# Patient Record
Sex: Female | Born: 1985 | Race: White | Hispanic: No | Marital: Married | Smoking: Never smoker
Health system: Southern US, Community
[De-identification: ages and names within clinical notes are randomized; demographics above are authoritative.]

## PROBLEM LIST (undated history)

## (undated) DIAGNOSIS — D649 Anemia, unspecified: Secondary | ICD-10-CM

## (undated) DIAGNOSIS — M549 Dorsalgia, unspecified: Secondary | ICD-10-CM

## (undated) DIAGNOSIS — F32A Depression, unspecified: Secondary | ICD-10-CM

## (undated) DIAGNOSIS — R011 Cardiac murmur, unspecified: Secondary | ICD-10-CM

## (undated) DIAGNOSIS — M2550 Pain in unspecified joint: Secondary | ICD-10-CM

## (undated) DIAGNOSIS — J9819 Other pulmonary collapse: Secondary | ICD-10-CM

## (undated) DIAGNOSIS — O24419 Gestational diabetes mellitus in pregnancy, unspecified control: Secondary | ICD-10-CM

## (undated) HISTORY — DX: Depression, unspecified: F32.A

## (undated) HISTORY — DX: Other pulmonary collapse: J98.19

## (undated) HISTORY — DX: Anemia, unspecified: D64.9

## (undated) HISTORY — DX: Gestational diabetes mellitus in pregnancy, unspecified control: O24.419

## (undated) HISTORY — DX: Pain in unspecified joint: M25.50

## (undated) HISTORY — PX: ANKLE SURGERY: SHX546

## (undated) HISTORY — DX: Dorsalgia, unspecified: M54.9

---

## 2005-09-18 ENCOUNTER — Emergency Department: Payer: Self-pay | Admitting: Emergency Medicine

## 2006-11-28 ENCOUNTER — Emergency Department: Payer: Self-pay | Admitting: Emergency Medicine

## 2007-07-06 ENCOUNTER — Emergency Department: Payer: Self-pay | Admitting: Emergency Medicine

## 2008-09-28 ENCOUNTER — Emergency Department: Payer: Self-pay | Admitting: Emergency Medicine

## 2010-09-17 ENCOUNTER — Inpatient Hospital Stay: Payer: Self-pay | Admitting: Psychiatry

## 2013-10-13 ENCOUNTER — Emergency Department: Payer: Self-pay | Admitting: Emergency Medicine

## 2013-10-13 LAB — CBC WITH DIFFERENTIAL/PLATELET
BASOS ABS: 0.1 10*3/uL (ref 0.0–0.1)
BASOS PCT: 0.6 %
EOS ABS: 0.1 10*3/uL (ref 0.0–0.7)
EOS PCT: 1.5 %
HCT: 40 % (ref 35.0–47.0)
HGB: 13.4 g/dL (ref 12.0–16.0)
Lymphocyte #: 2 10*3/uL (ref 1.0–3.6)
Lymphocyte %: 20.1 %
MCH: 29 pg (ref 26.0–34.0)
MCHC: 33.5 g/dL (ref 32.0–36.0)
MCV: 86 fL (ref 80–100)
MONO ABS: 0.8 x10 3/mm (ref 0.2–0.9)
Monocyte %: 7.7 %
Neutrophil #: 7.2 10*3/uL — ABNORMAL HIGH (ref 1.4–6.5)
Neutrophil %: 70.1 %
Platelet: 276 10*3/uL (ref 150–440)
RBC: 4.63 10*6/uL (ref 3.80–5.20)
RDW: 13.2 % (ref 11.5–14.5)
WBC: 10.2 10*3/uL (ref 3.6–11.0)

## 2013-10-13 LAB — COMPREHENSIVE METABOLIC PANEL
ALK PHOS: 69 U/L
ALT: 31 U/L (ref 12–78)
Albumin: 3.9 g/dL (ref 3.4–5.0)
Anion Gap: 3 — ABNORMAL LOW (ref 7–16)
BILIRUBIN TOTAL: 0.2 mg/dL (ref 0.2–1.0)
BUN: 17 mg/dL (ref 7–18)
CALCIUM: 9 mg/dL (ref 8.5–10.1)
Chloride: 106 mmol/L (ref 98–107)
Co2: 28 mmol/L (ref 21–32)
Creatinine: 0.7 mg/dL (ref 0.60–1.30)
EGFR (African American): >60
EGFR (Non-African Amer.): >60
GLUCOSE: 86 mg/dL (ref 65–99)
Osmolality: 275 (ref 275–301)
POTASSIUM: 3.5 mmol/L (ref 3.5–5.1)
SGOT(AST): 23 U/L (ref 15–37)
SODIUM: 137 mmol/L (ref 136–145)
Total Protein: 7.5 g/dL (ref 6.4–8.2)

## 2013-10-13 LAB — URINALYSIS, COMPLETE
BACTERIA: NONE SEEN
Bilirubin,UR: NEGATIVE
Glucose,UR: NEGATIVE mg/dL (ref 0–75)
KETONE: NEGATIVE
Nitrite: NEGATIVE
PROTEIN: NEGATIVE
Ph: 6 (ref 4.5–8.0)
SPECIFIC GRAVITY: 1.025 (ref 1.003–1.030)
Squamous Epithelial: 4

## 2013-10-13 LAB — HCG, QUANTITATIVE, PREGNANCY: BETA HCG, QUANT.: 423 m[IU]/mL — AB

## 2014-05-18 ENCOUNTER — Observation Stay: Payer: Self-pay

## 2014-05-18 LAB — URINALYSIS, COMPLETE
BACTERIA: NONE SEEN
BILIRUBIN, UR: NEGATIVE
BLOOD: NEGATIVE
Bacteria: NONE SEEN
Bilirubin,UR: NEGATIVE
Blood: NEGATIVE
Glucose,UR: NEGATIVE mg/dL (ref 0–75)
Glucose,UR: NEGATIVE mg/dL (ref 0–75)
Leukocyte Esterase: NEGATIVE
Leukocyte Esterase: NEGATIVE
NITRITE: NEGATIVE
Nitrite: NEGATIVE
PROTEIN: NEGATIVE
Ph: 7 (ref 4.5–8.0)
Ph: 7 (ref 4.5–8.0)
Protein: NEGATIVE
RBC,UR: <1 /HPF (ref 0–5)
Specific Gravity: 1.015 (ref 1.003–1.030)
Specific Gravity: 1.012 (ref 1.003–1.030)
Squamous Epithelial: 2
WBC UR: <1 /HPF (ref 0–5)

## 2014-06-20 ENCOUNTER — Inpatient Hospital Stay: Payer: Self-pay | Admitting: Obstetrics and Gynecology

## 2014-06-20 LAB — CBC WITH DIFFERENTIAL/PLATELET
BASOS ABS: 0.1 10*3/uL (ref 0.0–0.1)
BASOS PCT: 0.4 %
EOS ABS: 0.2 10*3/uL (ref 0.0–0.7)
Eosinophil %: 1.7 %
HCT: 31.9 % — ABNORMAL LOW (ref 35.0–47.0)
HGB: 9.7 g/dL — ABNORMAL LOW (ref 12.0–16.0)
Lymphocyte #: 2 10*3/uL (ref 1.0–3.6)
Lymphocyte %: 13.9 %
MCH: 22.6 pg — ABNORMAL LOW (ref 26.0–34.0)
MCHC: 30.4 g/dL — ABNORMAL LOW (ref 32.0–36.0)
MCV: 75 fL — ABNORMAL LOW (ref 80–100)
MONO ABS: 1 x10 3/mm — AB (ref 0.2–0.9)
Monocyte %: 7.2 %
NEUTROS ABS: 10.8 10*3/uL — AB (ref 1.4–6.5)
Neutrophil %: 76.8 %
PLATELETS: 250 10*3/uL (ref 150–440)
RBC: 4.28 10*6/uL (ref 3.80–5.20)
RDW: 16.1 % — AB (ref 11.5–14.5)
WBC: 14.1 10*3/uL — ABNORMAL HIGH (ref 3.6–11.0)

## 2014-06-22 LAB — HEMATOCRIT: HCT: 24.6 % — ABNORMAL LOW (ref 35.0–47.0)

## 2015-01-06 DIAGNOSIS — M19071 Primary osteoarthritis, right ankle and foot: Secondary | ICD-10-CM | POA: Insufficient documentation

## 2015-01-06 DIAGNOSIS — M87071 Idiopathic aseptic necrosis of right ankle: Secondary | ICD-10-CM | POA: Insufficient documentation

## 2015-01-11 ENCOUNTER — Other Ambulatory Visit: Payer: Self-pay | Admitting: Orthopaedic Surgery

## 2015-01-11 DIAGNOSIS — M25571 Pain in right ankle and joints of right foot: Secondary | ICD-10-CM

## 2015-01-20 ENCOUNTER — Ambulatory Visit
Admission: RE | Admit: 2015-01-20 | Discharge: 2015-01-20 | Disposition: A | Discharge status: Home / Self Care | Payer: No Typology Code available for payment source | Source: Ambulatory Visit | Attending: Orthopaedic Surgery | Admitting: Orthopaedic Surgery

## 2015-01-20 DIAGNOSIS — Z9889 Other specified postprocedural states: Secondary | ICD-10-CM | POA: Diagnosis not present

## 2015-01-20 DIAGNOSIS — M25571 Pain in right ankle and joints of right foot: Secondary | ICD-10-CM

## 2015-01-20 DIAGNOSIS — M19071 Primary osteoarthritis, right ankle and foot: Secondary | ICD-10-CM | POA: Insufficient documentation

## 2015-01-20 DIAGNOSIS — Z87828 Personal history of other (healed) physical injury and trauma: Secondary | ICD-10-CM | POA: Insufficient documentation

## 2015-01-26 NOTE — H&P (Signed)
L&D Evaluation:  History:  HPI 28yo MWF presents at 34w with c/o vaginal spotting x 1 day and n/v x 2 days with low grade fever. denies decreased fetal mov't or other concerns; PNC normal to date.   Presents with back pain, nausea/vomiting, vaginal bleeding, HA   Patient's Medical History No Chronic Illness   Patient's Surgical History Appendectomy   Medications Pre Natal Vitamins  Iron  Tylenol (Acetaminophen)  phenergan prn   Allergies NKDA   Social History none   Family History Non-Contributory   ROS:  ROS All systems were reviewed.  HEENT, CNS, GI, GU, Respiratory, CV, Renal and Musculoskeletal systems were found to be normal.   Exam:  Vital Signs stable   Urine Protein negative dipstick   General no apparent distress   Mental Status clear   Abdomen gravid, non-tender   Estimated Fetal Weight Average for gestational age   Edema no edema   Pelvic cervix closed and thick   Mebranes Intact   FHT normal rate with no decels   Fetal Heart Rate 150   Ucx absent   Skin dry   Impression:  Impression dehydration, viral gastritis at 34w   Plan:  Plan UA, EFM/NST, fluids, antiemetics   Follow Up Appointment d/c home after 2nd bag infused and feeling better   Electronic Signatures: Ulyses AmorBurr, Melody N (CNM)  (Signed 31-Aug-15 17:46)  Authored: L&D Evaluation   Last Updated: 31-Aug-15 17:46 by Ulyses AmorBurr, Melody N (CNM)

## 2015-01-26 NOTE — H&P (Signed)
L&D Evaluation:  History:  HPI presents at 246w4d with SROM at 0230a of clear fluid with bloody show; irregular contractions; G1P0000. Normal PNC to date, LLP resolved by 28 weeks   Presents with leaking fluid   Patient's Medical History No Chronic Illness   Patient's Surgical History Appendectomy  knee surgery   Medications Pre Natal Vitamins  Iron  Tylenol (Acetaminophen)   Allergies NKDA   Social History tobacco  EtOH  none since pregnant   Family History Non-Contributory   ROS:  ROS All systems were reviewed.  HEENT, CNS, GI, GU, Respiratory, CV, Renal and Musculoskeletal systems were found to be normal.   Exam:  Vital Signs stable   General no apparent distress   Mental Status clear   Chest clear   Heart normal sinus rhythm   Abdomen gravid, non-tender   Estimated Fetal Weight Average for gestational age   Fetal Position vtx   Edema no edema   Pelvic ft/50/-2 per RN exam   Mebranes Ruptured   Description clear   FHT normal rate with no decels   Ucx irregular   Impression:  Impression SROM @ 39 weeks   Plan:  Plan monitor contractions and for cervical change   Electronic Signatures: Yolanda BonineBurr, Yahsir Wickens N (CNM)  (Signed 03-Oct-15 05:15)  Authored: L&D Evaluation   Last Updated: 03-Oct-15 05:15 by Ulyses AmorBurr, Doshia Dalia N (CNM)

## 2015-10-10 ENCOUNTER — Encounter: Payer: Self-pay | Admitting: *Deleted

## 2015-10-10 DIAGNOSIS — R112 Nausea with vomiting, unspecified: Secondary | ICD-10-CM | POA: Insufficient documentation

## 2015-10-10 DIAGNOSIS — R197 Diarrhea, unspecified: Secondary | ICD-10-CM | POA: Insufficient documentation

## 2015-10-10 DIAGNOSIS — Z3202 Encounter for pregnancy test, result negative: Secondary | ICD-10-CM | POA: Insufficient documentation

## 2015-10-10 DIAGNOSIS — N390 Urinary tract infection, site not specified: Secondary | ICD-10-CM | POA: Insufficient documentation

## 2015-10-10 LAB — URINALYSIS COMPLETE WITH MICROSCOPIC (ARMC ONLY)
BACTERIA UA: NONE SEEN
Bilirubin Urine: NEGATIVE
Glucose, UA: NEGATIVE mg/dL
Ketones, ur: NEGATIVE mg/dL
NITRITE: NEGATIVE
PROTEIN: NEGATIVE mg/dL
SPECIFIC GRAVITY, URINE: 1.029 (ref 1.005–1.030)
pH: 5 (ref 5.0–8.0)

## 2015-10-10 LAB — COMPREHENSIVE METABOLIC PANEL
ALBUMIN: 3.9 g/dL (ref 3.5–5.0)
ALT: 39 U/L (ref 14–54)
AST: 35 U/L (ref 15–41)
Alkaline Phosphatase: 77 U/L (ref 38–126)
Anion gap: 8 (ref 5–15)
BUN: 15 mg/dL (ref 6–20)
CHLORIDE: 105 mmol/L (ref 101–111)
CO2: 23 mmol/L (ref 22–32)
Calcium: 8.7 mg/dL — ABNORMAL LOW (ref 8.9–10.3)
Creatinine, Ser: 0.74 mg/dL (ref 0.44–1.00)
GFR calc Af Amer: >60 mL/min (ref >60)
GFR calc non Af Amer: >60 mL/min (ref >60)
GLUCOSE: 111 mg/dL — AB (ref 65–99)
POTASSIUM: 3.8 mmol/L (ref 3.5–5.1)
Sodium: 136 mmol/L (ref 135–145)
Total Bilirubin: 0.4 mg/dL (ref 0.3–1.2)
Total Protein: 7.4 g/dL (ref 6.5–8.1)

## 2015-10-10 LAB — CBC
HEMATOCRIT: 42.9 % (ref 35.0–47.0)
Hemoglobin: 14.1 g/dL (ref 12.0–16.0)
MCH: 27 pg (ref 26.0–34.0)
MCHC: 33 g/dL (ref 32.0–36.0)
MCV: 81.7 fL (ref 80.0–100.0)
Platelets: 212 10*3/uL (ref 150–440)
RBC: 5.25 MIL/uL — ABNORMAL HIGH (ref 3.80–5.20)
RDW: 13.9 % (ref 11.5–14.5)
WBC: 6.7 10*3/uL (ref 3.6–11.0)

## 2015-10-10 LAB — POCT PREGNANCY, URINE: PREG TEST UR: NEGATIVE

## 2015-10-10 LAB — LIPASE, BLOOD: Lipase: 24 U/L (ref 11–51)

## 2015-10-10 NOTE — ED Notes (Signed)
Pt c/o abdominal pain x 3 weeks, black stool starting x 2 days ago. Pt c/o increased abdominal pain when black stools began. Pt c/o nausea, vomiting x 1. Pt denies dysuria.j

## 2015-10-11 ENCOUNTER — Emergency Department: Payer: No Typology Code available for payment source

## 2015-10-11 ENCOUNTER — Emergency Department
Admission: EM | Admit: 2015-10-11 | Discharge: 2015-10-11 | Disposition: A | Discharge status: Home / Self Care | Payer: Self-pay | Attending: Emergency Medicine | Admitting: Emergency Medicine

## 2015-10-11 DIAGNOSIS — N390 Urinary tract infection, site not specified: Secondary | ICD-10-CM

## 2015-10-11 DIAGNOSIS — R1084 Generalized abdominal pain: Secondary | ICD-10-CM

## 2015-10-11 DIAGNOSIS — R197 Diarrhea, unspecified: Secondary | ICD-10-CM

## 2015-10-11 MED ORDER — CIPROFLOXACIN HCL 500 MG PO TABS: 500.0000 mg | ORAL_TABLET | Freq: Two times a day (BID) | ORAL | Status: DC

## 2015-10-11 MED ORDER — CIPROFLOXACIN HCL 500 MG PO TABS
500.0000 mg | ORAL_TABLET | Freq: Once | ORAL | Status: AC
  Administered 2015-10-11: 500 mg via ORAL
  Filled 2015-10-11: qty 1

## 2015-10-11 MED ORDER — IOHEXOL 240 MG/ML SOLN
25.0000 mL | Freq: Once | INTRAMUSCULAR | Status: AC | PRN
  Administered 2015-10-11: 25 mL via ORAL

## 2015-10-11 MED ORDER — ONDANSETRON 4 MG PO TBDP: 4.0000 mg | ORAL_TABLET | Freq: Three times a day (TID) | ORAL | Status: DC | PRN

## 2015-10-11 MED ORDER — FAMOTIDINE 20 MG PO TABS: 20.0000 mg | ORAL_TABLET | Freq: Two times a day (BID) | ORAL | Status: DC

## 2015-10-11 MED ORDER — ONDANSETRON HCL 4 MG/2ML IJ SOLN
4.0000 mg | Freq: Once | INTRAMUSCULAR | Status: AC
  Administered 2015-10-11: 4 mg via INTRAVENOUS
  Filled 2015-10-11: qty 2

## 2015-10-11 MED ORDER — MORPHINE SULFATE (PF) 2 MG/ML IV SOLN
2.0000 mg | Freq: Once | INTRAVENOUS | Status: AC
  Administered 2015-10-11: 2 mg via INTRAVENOUS
  Filled 2015-10-11: qty 1

## 2015-10-11 MED ORDER — SODIUM CHLORIDE 0.9 % IV BOLUS (SEPSIS)
1000.0000 mL | Freq: Once | INTRAVENOUS | Status: AC
  Administered 2015-10-11: 1000 mL via INTRAVENOUS

## 2015-10-11 MED ORDER — IOHEXOL 300 MG/ML  SOLN
100.0000 mL | Freq: Once | INTRAMUSCULAR | Status: AC | PRN
  Administered 2015-10-11: 100 mL via INTRAVENOUS

## 2015-10-11 MED ORDER — DICYCLOMINE HCL 20 MG PO TABS: 20.0000 mg | ORAL_TABLET | Freq: Four times a day (QID) | ORAL | Status: DC | PRN

## 2015-10-11 NOTE — ED Notes (Signed)
Patient transported to CT 

## 2015-10-11 NOTE — ED Notes (Signed)
Patient with no complaints at this time. Respirations even and unlabored. Skin warm/dry. Discharge instructions reviewed with patient at this time. Patient given opportunity to voice concerns/ask questions. IV removed per policy and band-aid applied to site. Patient discharged at this time and left Emergency Department with steady gait.  

## 2015-10-11 NOTE — Discharge Instructions (Signed)
1. Take antibiotic as prescribed (Cipro 500 mg twice daily 3 days). 2. Start stomach medicine as prescribed (Pepcid 20 mg twice daily #60). 3. You may take medicines as needed for pain and nausea (Bentyl/Zofran #20). 4. Bland diet 5 days, then slowly advance diet as tolerated. 5. Return to the ER for worsening symptoms, persistent vomiting, difficulty breathing or other concerns.  Abdominal Pain, Adult Many things can cause abdominal pain. Usually, abdominal pain is not caused by a disease and will improve without treatment. It can often be observed and treated at home. Your health care provider will do a physical exam and possibly order blood tests and X-rays to help determine the seriousness of your pain. However, in many cases, more time must pass before a clear cause of the pain can be found. Before that point, your health care provider may not know if you need more testing or further treatment. HOME CARE INSTRUCTIONS Monitor your abdominal pain for any changes. The following actions may help to alleviate any discomfort you are experiencing:  Only take over-the-counter or prescription medicines as directed by your health care provider.  Do not take laxatives unless directed to do so by your health care provider.  Try a clear liquid diet (broth, tea, or water) as directed by your health care provider. Slowly move to a bland diet as tolerated. SEEK MEDICAL CARE IF:  You have unexplained abdominal pain.  You have abdominal pain associated with nausea or diarrhea.  You have pain when you urinate or have a bowel movement.  You experience abdominal pain that wakes you in the night.  You have abdominal pain that is worsened or improved by eating food.  You have abdominal pain that is worsened with eating fatty foods.  You have a fever. SEEK IMMEDIATE MEDICAL CARE IF:  Your pain does not go away within 2 hours.  You keep throwing up (vomiting).  Your pain is felt only in portions  of the abdomen, such as the right side or the left lower portion of the abdomen.  You pass bloody or black tarry stools. MAKE SURE YOU:  Understand these instructions.  Will watch your condition.  Will get help right away if you are not doing well or get worse.   This information is not intended to replace advice given to you by your health care provider. Make sure you discuss any questions you have with your health care provider.   Document Released: 06/14/2005 Document Revised: 05/26/2015 Document Reviewed: 05/14/2013 Elsevier Interactive Patient Education 2016 [employer]  Urinary Tract Infection Urinary tract infections (UTIs) can develop anywhere along your urinary tract. Your urinary tract is your body's drainage system for removing wastes and extra water. Your urinary tract includes two kidneys, two ureters, a bladder, and a urethra. Your kidneys are a pair of bean-shaped organs. Each kidney is about the size of your fist. They are located below your ribs, one on each side of your spine. CAUSES Infections are caused by microbes, which are microscopic organisms, including fungi, viruses, and bacteria. These organisms are so small that they can only be seen through a microscope. Bacteria are the microbes that most commonly cause UTIs. SYMPTOMS  Symptoms of UTIs may vary by age and gender of the patient and by the location of the infection. Symptoms in young women typically include a frequent and intense urge to urinate and a painful, burning feeling in the bladder or urethra during urination. Older women and men are more likely to be  tired, shaky, and weak and have muscle aches and abdominal pain. A fever may mean the infection is in your kidneys. Other symptoms of a kidney infection include pain in your back or sides below the ribs, nausea, and vomiting. DIAGNOSIS To diagnose a UTI, your caregiver will ask you about your symptoms. Your caregiver will also ask you to provide a urine  sample. The urine sample will be tested for bacteria and white blood cells. White blood cells are made by your body to help fight infection. TREATMENT  Typically, UTIs can be treated with medication. Because most UTIs are caused by a bacterial infection, they usually can be treated with the use of antibiotics. The choice of antibiotic and length of treatment depend on your symptoms and the type of bacteria causing your infection. HOME CARE INSTRUCTIONS  If you were prescribed antibiotics, take them exactly as your caregiver instructs you. Finish the medication even if you feel better after you have only taken some of the medication.  Drink enough water and fluids to keep your urine clear or pale yellow.  Avoid caffeine, tea, and carbonated beverages. They tend to irritate your bladder.  Empty your bladder often. Avoid holding urine for long periods of time.  Empty your bladder before and after sexual intercourse.  After a bowel movement, women should cleanse from front to back. Use each tissue only once. SEEK MEDICAL CARE IF:   You have back pain.  You develop a fever.  Your symptoms do not begin to resolve within 3 days. SEEK IMMEDIATE MEDICAL CARE IF:   You have severe back pain or lower abdominal pain.  You develop chills.  You have nausea or vomiting.  You have continued burning or discomfort with urination. MAKE SURE YOU:   Understand these instructions.  Will watch your condition.  Will get help right away if you are not doing well or get worse.   This information is not intended to replace advice given to you by your health care provider. Make sure you discuss any questions you have with your health care provider.   Document Released: 06/14/2005 Document Revised: 05/26/2015 Document Reviewed: 10/13/2011 Elsevier Interactive Patient Education 2016 Elsevier Inc.  Bloody Diarrhea Bloody diarrhea can be caused by many different conditions. Most of the time bloody  diarrhea is the result of food poisoning or minor infections. Bloody diarrhea usually improves over 2 to 3 days of rest and fluid replacement. Other conditions that can cause bloody diarrhea include:  Internal bleeding.  Infection.  Diseases of the bowel and colon. Internal bleeding from an ulcer or bowel disease can be severe and requires hospital care or even surgery. DIAGNOSIS  To find out what is wrong your caregiver may check your:  Stool.  Blood.  Results from a test that looks inside the body (endoscopy). TREATMENT   Get plenty of rest.  Drink enough water and fluids to keep your urine clear or pale yellow.  Do not smoke.  Solid foods and dairy products should be avoided until your illness improves.  As you improve, slowly return to a regular diet with easily-digested foods first. Examples are:  Bananas.  Rice.  Toast.  Crackers. You should only need these for about 2 days before adding more normal foods to your diet.  Avoid spicy or fatty foods as well as caffeine and alcohol for several days.  Medicine to control cramping and diarrhea can relieve symptoms but may prolong some cases of bloody diarrhea. Antibiotics can speed recovery from  diarrhea due to some bacterial infections. Call your caregiver if diarrhea does not get better in 3 days. SEEK MEDICAL CARE IF:   You do not improve after 3 days.  Your diarrhea improves but your stool appears black. SEEK IMMEDIATE MEDICAL CARE IF:   You become extremely weak or faint.  You become very sweaty.  You have increased pain or bleeding.  You develop repeated vomiting.  You vomit and you see blood or the vomit looks black in color.  You have a fever.   This information is not intended to replace advice given to you by your health care provider. Make sure you discuss any questions you have with your health care provider.   Document Released: 09/04/2005 Document Revised: 09/25/2014 Document Reviewed:  08/06/2009 Elsevier Interactive Patient Education [employer]

## 2015-10-11 NOTE — ED Notes (Signed)
MD at bedside. 

## 2015-10-11 NOTE — ED Provider Notes (Signed)
[facility] Emergency Department Provider Note  ____________________________________________  Time seen: Approximately 1:08 AM  I have reviewed the triage vital signs and the nursing notes.   HISTORY  Chief Complaint Abdominal Pain    HPI Sheila Mendez is a 30 y.o. female who presents to the ED from home with a chief complaint of abdominal pain. Patient complains of generalized abdominal pain 3 weeks. Noted loose, black stools 2 days. Patient states she has had approximately 5-7 episodes of diarrhea. Had one episode of nausea and vomiting. Denies use of anticoagulants or iron tablets. Denies recent travel or trauma. Denies associated fever, chills, chest pain, shortness of breath, dysuria. Has not had prior symptoms. No personal history of bowel issues. Nothing makes her pain better or worse.Denies daily alcohol use. Denies excessive use of NSAIDs. Denies pelvic pain, vaginal discharge or vaginal bleeding. No prior history of ovarian cysts.   Past Medical history None  There are no active problems to display for this patient.   History reviewed. No pertinent past surgical history.  No current outpatient prescriptions on file.  Allergies Review of patient's allergies indicates no known allergies.  History reviewed. No pertinent family history.  Social History Social History  Substance Use Topics  . Smoking status: Never Smoker   . Smokeless tobacco: Never Used  . Alcohol Use: Yes     Comment: rarely    Review of Systems Constitutional: No fever/chills Eyes: No visual changes. ENT: No sore throat. Cardiovascular: Denies chest pain. Respiratory: Denies shortness of breath. Gastrointestinal: Positive for abdominal pain.  Positive for one episode of nausea and vomiting.  Positive for several episodes of dark-looking diarrhea.  No constipation. Genitourinary: Negative for dysuria. Musculoskeletal: Negative for back pain. Skin: Negative for  rash. Neurological: Negative for headaches, focal weakness or numbness.  10-point ROS otherwise negative.  ____________________________________________   PHYSICAL EXAM:  VITAL SIGNS: ED Triage Vitals  Enc Vitals Group     BP 10/10/15 2145 121/78 mmHg     Pulse Rate 10/10/15 2145 115     Resp 10/10/15 2145 18     Temp 10/10/15 2145 99.1 F (37.3 C)     Temp Source 10/10/15 2145 Oral     SpO2 10/10/15 2145 95 %     Weight 10/10/15 2145 160 lb (72.576 kg)     Height 10/10/15 2145  (1.626 m)     Head Cir --      Peak Flow --      Pain Score 10/10/15 2151 5     Pain Loc --      Pain Edu? --      Excl. in GC? --     Constitutional: Alert and oriented. Well appearing and in no acute distress. Eyes: Conjunctivae are normal. PERRL. EOMI. Head: Atraumatic. Nose: No congestion/rhinnorhea. Mouth/Throat: Mucous membranes are moist.  Oropharynx non-erythematous. Neck: No stridor.   Cardiovascular: Normal rate, regular rhythm. Grossly normal heart sounds.  Good peripheral circulation. Respiratory: Normal respiratory effort.  No retractions. Lungs CTAB. Gastrointestinal: Soft and mildly tender to palpation diffusely without rebound or guarding. Small tenderness at epigastrium and left lower quadrant. No distention. No abdominal bruits. No CVA tenderness. Musculoskeletal: No lower extremity tenderness nor edema.  No joint effusions. Neurologic:  Normal speech and language. No gross focal neurologic deficits are appreciated. No gait instability. Skin:  Skin is warm, dry and intact. No rash noted. Psychiatric: Mood and affect are normal. Speech and behavior are normal.  ____________________________________________   LABS (  all labs ordered are listed, but only abnormal results are displayed)  Labs Reviewed  COMPREHENSIVE METABOLIC PANEL - Abnormal; Notable for the following:    Glucose, Bld 111 (*)    Calcium 8.7 (*)    All other components within normal limits  CBC - Abnormal;  Notable for the following:    RBC 5.25 (*)    All other components within normal limits  URINALYSIS COMPLETEWITH MICROSCOPIC ([facility] ONLY) - Abnormal; Notable for the following:    Color, Urine YELLOW (*)    APPearance HAZY (*)    Hgb urine dipstick 1+ (*)    Leukocytes, UA 1+ (*)    Squamous Epithelial / LPF 0-5 (*)    All other components within normal limits  LIPASE, BLOOD  POC OCCULT BLOOD, ED  POC URINE PREG, ED  POCT PREGNANCY, URINE   ____________________________________________  EKG  None ____________________________________________  RADIOLOGY  CT abdomen/pelvis interpreted per Dr. Cherly Hensen: Unremarkable contrast-enhanced CT of the abdomen and pelvis. ____________________________________________   PROCEDURES  Procedure(s) performed:   Rectal exam: No gross blood evident. No external hemorrhoids. Digital rectal exam performed on gloved finger; no stool obtained.   Critical Care performed: No  ____________________________________________   INITIAL IMPRESSION / ASSESSMENT AND PLAN / ED COURSE  Pertinent labs & imaging results that were available during my care of the patient were reviewed by me and considered in my medical decision making (see chart for details).  30 year old female who presents with a 3 week history of generalized abdominal pain, maximally and epigastrium and left lower quadrant. Also complains of black, loose stools starting 2 days ago. Updated patient of unremarkable laboratory results. Urinalysis notable for 1+ leukocytes. Will initiate IV fluid resuscitation, obtain CT abdomen/pelvis to evaluate for etiology of patient's abdominal pain.  ----------------------------------------- 2:34 AM on 10/11/2015 -----------------------------------------  Patient feeling better. Updated patient and family member of negative CT results. Feel patient's symptoms likely consistent with gastritis. Will place on H2 blockers and follow-up with GI on an  outpatient basis for further evaluation. Will treat UTI. Strict return precautions given. Patient verbalizes understanding and agrees with plan of care. ____________________________________________   FINAL CLINICAL IMPRESSION(S) / ED DIAGNOSES  Final diagnoses:  Generalized abdominal pain  UTI (lower urinary tract infection)      Irean Hong, MD 10/11/15 [id number]

## 2017-09-08 IMAGING — CT CT ABD-PELV W/ CM
1 of 2 series · 15 of 32 positions shown, 19 images · IV contrast (omnipaque)
Comparison: Pelvic ultrasound performed 10/13/2013

CLINICAL DATA: Acute onset of generalized abdominal pain and
nausea. Tarry stools. Initial encounter.

EXAM:
CT ABDOMEN AND PELVIS WITH CONTRAST
TECHNIQUE: Multidetector CT imaging of the abdomen and pelvis was performed
using the standard protocol following bolus administration of
intravenous contrast.
CONTRAST:  100mL OMNIPAQUE IOHEXOL 300 MG/ML  SOLN

[Series 2: routine abd pel with · axial · 0.76mm/px · z∈[-1058,-612]mm · 15 of 99 slices shown, 19 images]
[im 5/99  soft-tissue]
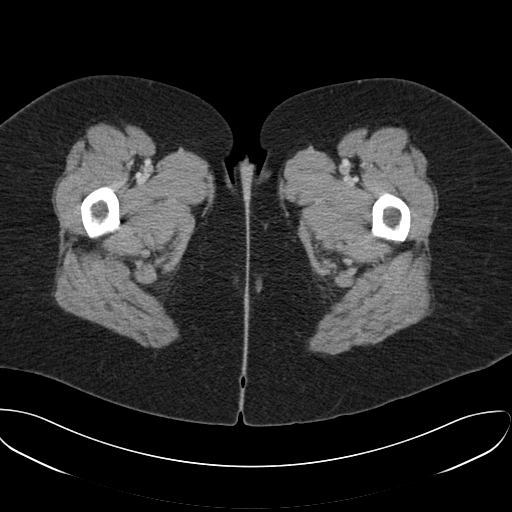
[im 5/99  bone]
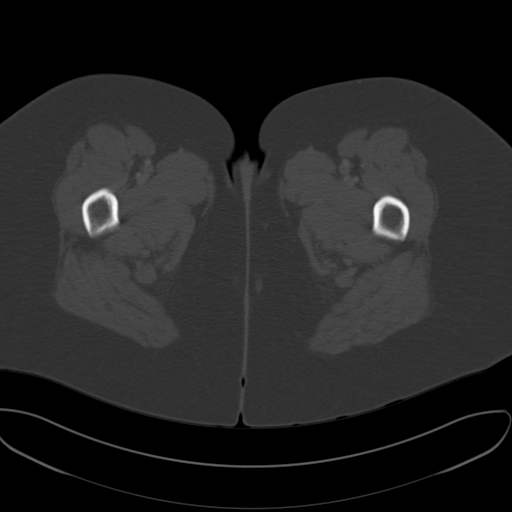
[im 13/99  soft-tissue]
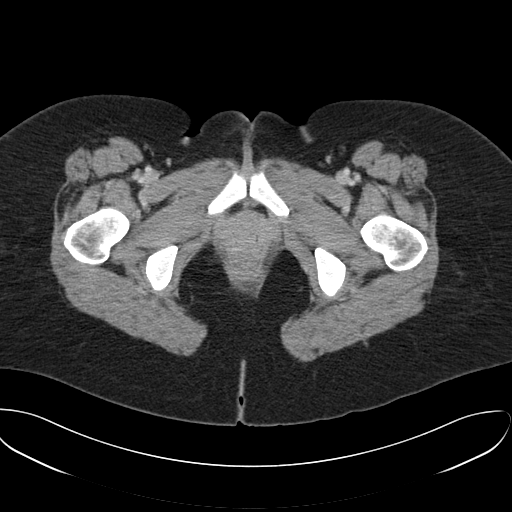
[im 21/99  soft-tissue]
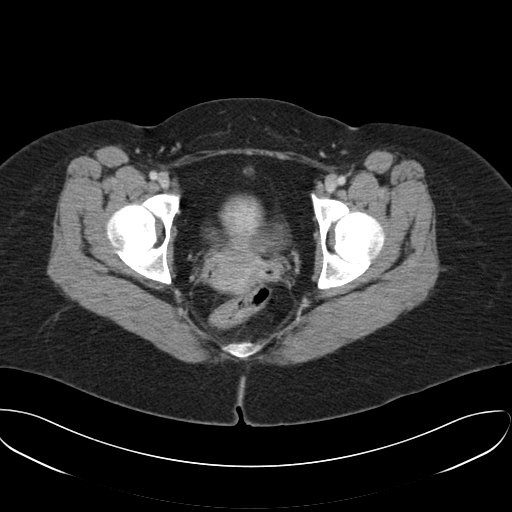
[im 29/99  soft-tissue]
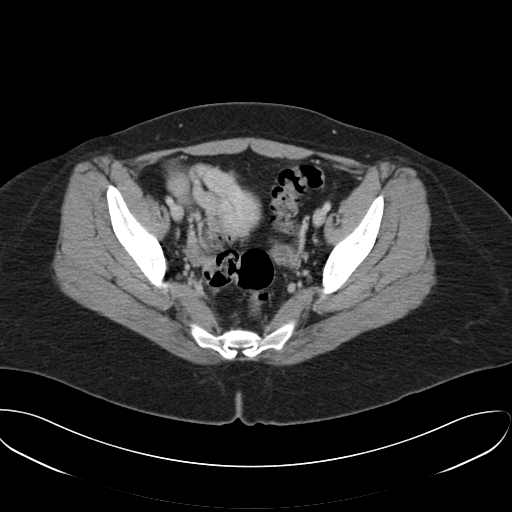
[im 33/99  soft-tissue]
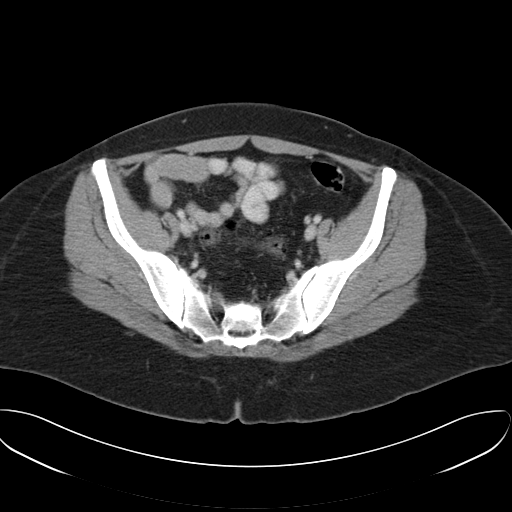
[im 41/99  soft-tissue]
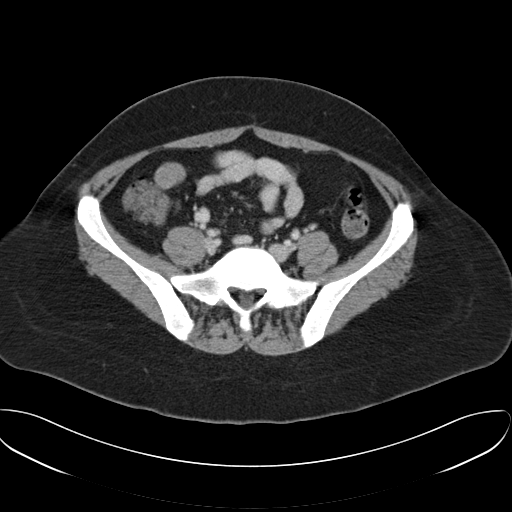
[im 50/99  soft-tissue]
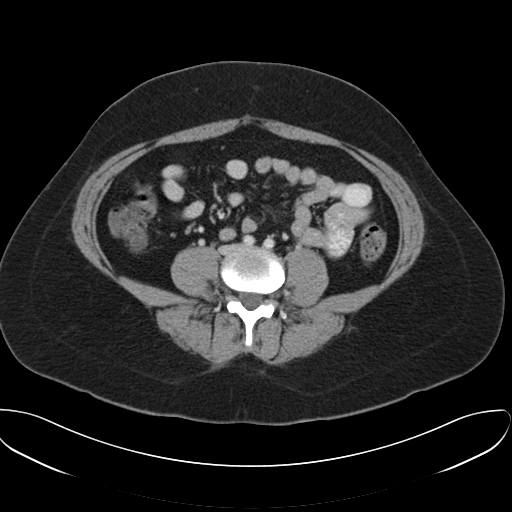
[im 58/99  soft-tissue]
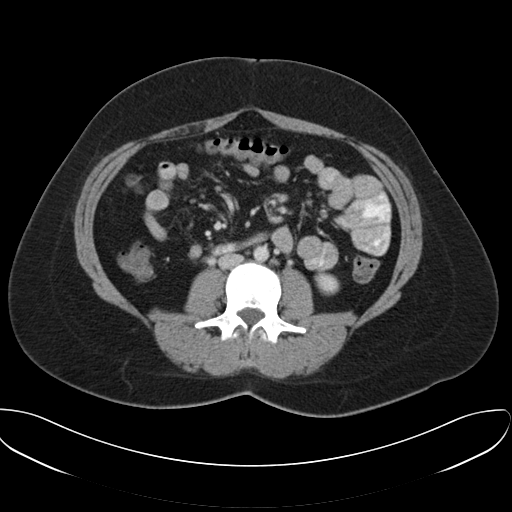
[im 66/99  soft-tissue]
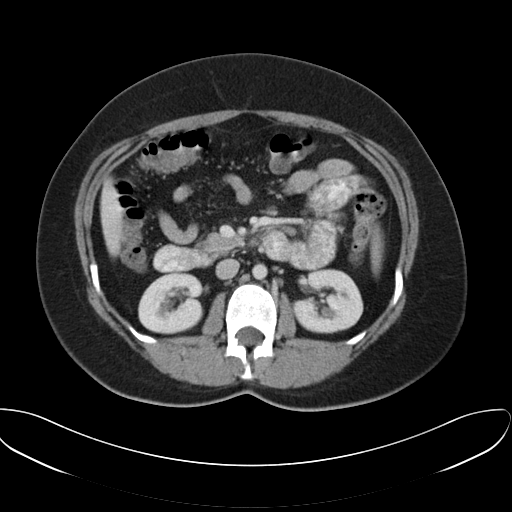
[im 66/99  bone]
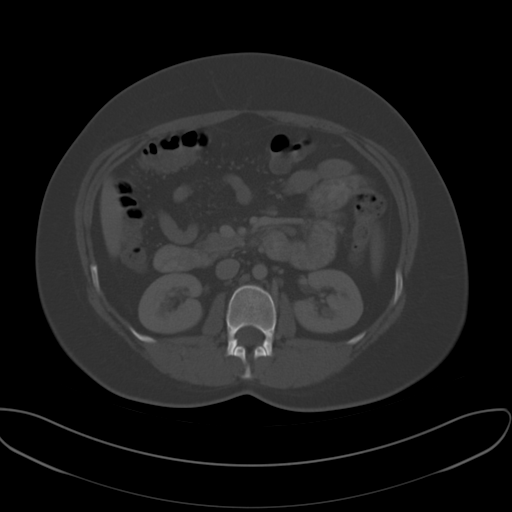
[im 70/99  soft-tissue]
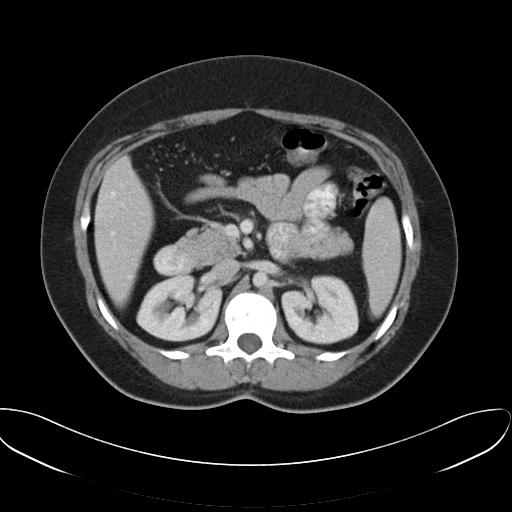
[im 78/99  soft-tissue]
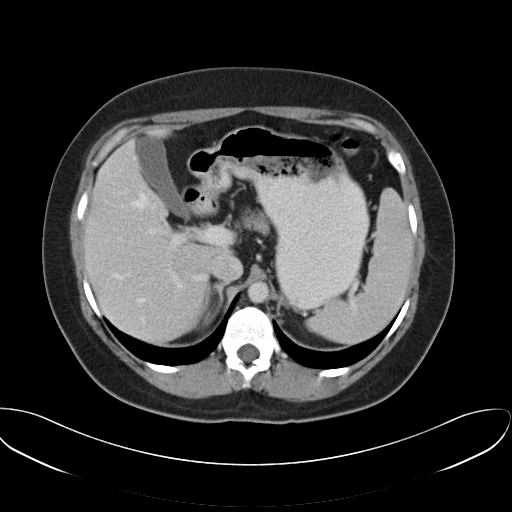
[im 82/99  lung]
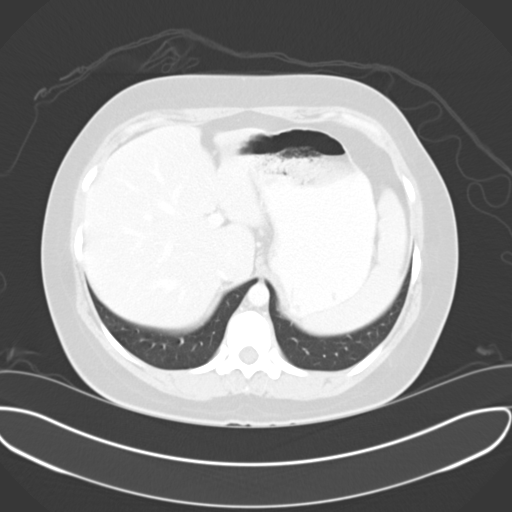
[im 86/99  soft-tissue]
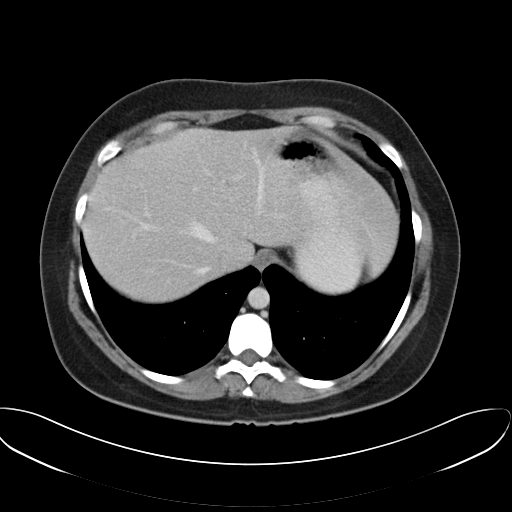
[im 86/99  lung]
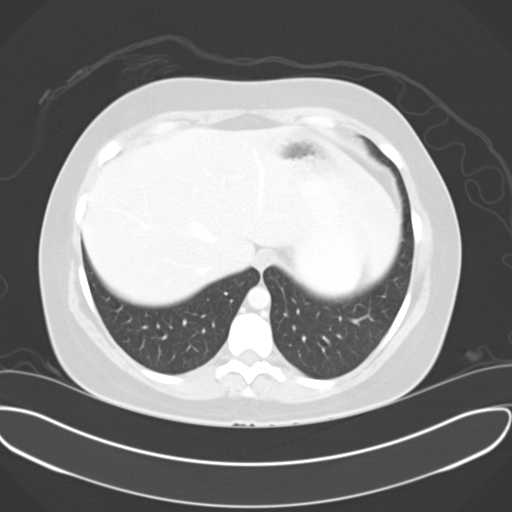
[im 90/99  lung]
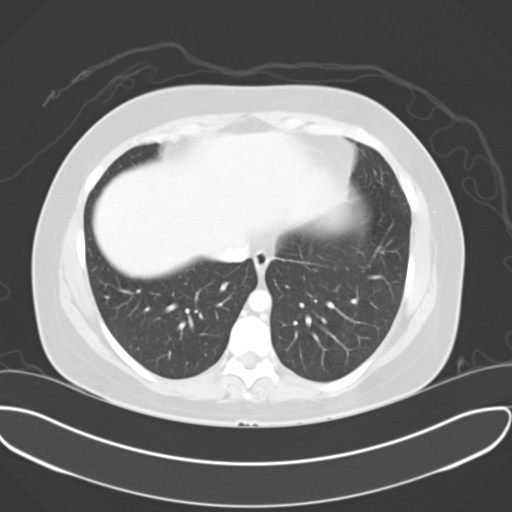
[im 94/99  soft-tissue]
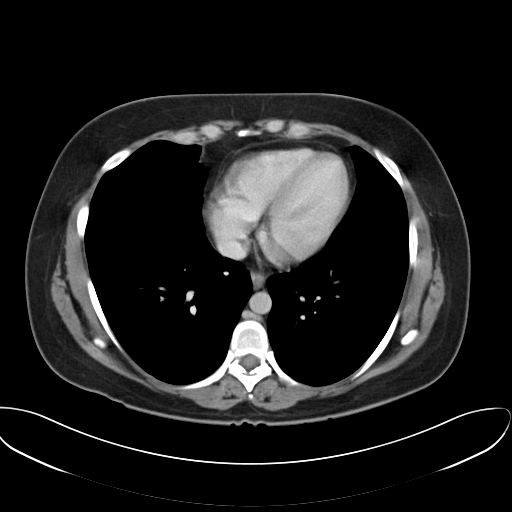
[im 94/99  lung]
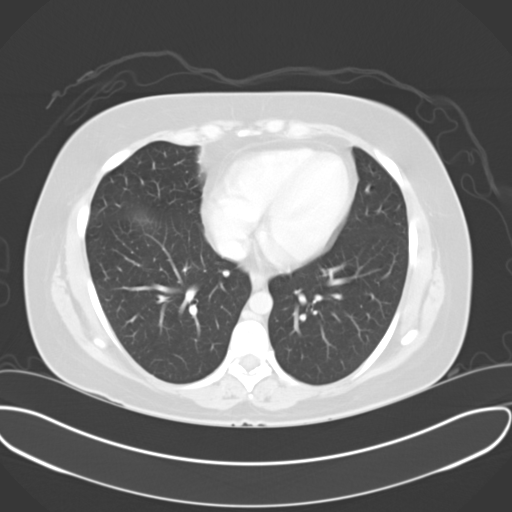

[15 of 32 positions shown; findings below may reference images not displayed]

FINDINGS: The visualized lung bases are clear.

The liver and spleen are unremarkable in appearance. The gallbladder
is within normal limits. The pancreas and adrenal glands are
unremarkable.

The kidneys are unremarkable in appearance. There is no evidence of
hydronephrosis. No renal or ureteral stones are seen. No perinephric
stranding is appreciated.

No free fluid is identified. The small bowel is unremarkable in
appearance. The stomach is within normal limits. No acute vascular
abnormalities are seen.

The appendix is normal in caliber, without evidence of appendicitis.
The colon is partially filled with fluid and air, and is grossly
unremarkable in appearance.

The bladder is mildly distended grossly remarkable. The uterus is
unremarkable in appearance. The ovaries are relatively symmetric. No
suspicious adnexal masses are seen. No inguinal lymphadenopathy is
seen.

No acute osseous abnormalities are identified.
IMPRESSION: Unremarkable contrast-enhanced CT of the abdomen and pelvis.

## 2017-09-18 NOTE — L&D Delivery Note (Signed)
Delivery Note:  0907 In room to see patient, reports pelvic pressure and the urge to push. Room prepared for second stage. SVE: 10/100/+1, vertex.   Effective maternal pushing efforts in various positions. [id number] Spontaneous vaginal birth of liveborn female infant in middle occiput anterior position with vertex/compound-left arm presentation. Infant immediately to maternal abdomen. Delayed cord clamping, skin to skin, three (3) vessel cord, and cord blood collected. APGARS: 8, 9. Weight: 3590 g (7lb 15 oz). Receiving nurse present at bedside for birth.   [id number] Spontaneous delivery of intact placenta. Pitocin started. Uterus firm. Large gush of blood present. Inspection of vaginal vault revealed sulcus laceration. Vaginal packing placed. Dr. Valentino Saxonherry notified and requested made for bedside evaluation. Vaginal and perineal inspection preformed by Dr. Valentino Saxonherry with adequate epidural anesthesia. Unrepaired sulcus laceration, hemostatic after removal of vaginal packing. Third degree perineal laceration repaired until second degree laceration with 2-0 vicyrl by Dr. Valentino Saxonherry. Second degree perineal and side wall lacerations repaired with 3-0 vicryl rapide by CNM. Lacerations, well approximated and hemostatic. Vault check completed. Uterus: firm. Lochia: small. Counts correct x 2. QBL: 245 ml.   Initiate routine postpartum care and orders. Mom to postpartum.  Baby to Couplet care / Skin to Skin.  FOB present at bedside and overjoyed with the birth of "[name]".    Sheila Mendez, CNM [facility], [id number] 09/13/2018, 11:39 AM

## 2018-02-27 ENCOUNTER — Telehealth: Payer: Self-pay | Admitting: Obstetrics and Gynecology

## 2018-02-27 NOTE — Telephone Encounter (Signed)
The patient called and stated that she had her first abby with Melody, Now the patient is coming back to our office for prenatal care and is experiencing extreme nausea and sickness. She is unable to keep any food down. The patient is hoping to have something sent in to help due to her having to reschedule her Nurse visit due to short nurse staff. No other information was disclosed. Please advise.

## 2018-03-11 ENCOUNTER — Ambulatory Visit: Payer: Medicaid Other | Admitting: Certified Nurse Midwife

## 2018-03-11 ENCOUNTER — Encounter: Payer: Self-pay | Admitting: Certified Nurse Midwife

## 2018-03-11 ENCOUNTER — Ambulatory Visit (INDEPENDENT_AMBULATORY_CARE_PROVIDER_SITE_OTHER): Payer: Medicaid Other

## 2018-03-11 ENCOUNTER — Ambulatory Visit (INDEPENDENT_AMBULATORY_CARE_PROVIDER_SITE_OTHER): Payer: Medicaid Other | Admitting: Certified Nurse Midwife

## 2018-03-11 ENCOUNTER — Other Ambulatory Visit: Payer: Self-pay | Admitting: Certified Nurse Midwife

## 2018-03-11 VITALS — BP 122/63 | HR 84 | Wt 188.7 lb

## 2018-03-11 DIAGNOSIS — E663 Overweight: Secondary | ICD-10-CM

## 2018-03-11 DIAGNOSIS — N926 Irregular menstruation, unspecified: Secondary | ICD-10-CM

## 2018-03-11 DIAGNOSIS — Z3A13 13 weeks gestation of pregnancy: Secondary | ICD-10-CM

## 2018-03-11 DIAGNOSIS — O219 Vomiting of pregnancy, unspecified: Secondary | ICD-10-CM

## 2018-03-11 DIAGNOSIS — Z124 Encounter for screening for malignant neoplasm of cervix: Secondary | ICD-10-CM

## 2018-03-11 DIAGNOSIS — Z3492 Encounter for supervision of normal pregnancy, unspecified, second trimester: Secondary | ICD-10-CM

## 2018-03-11 DIAGNOSIS — O3680X0 Pregnancy with inconclusive fetal viability, not applicable or unspecified: Secondary | ICD-10-CM

## 2018-03-11 MED ORDER — PROMETHAZINE HCL 25 MG PO TABS: 25.0000 mg | ORAL_TABLET | Freq: Four times a day (QID) | ORAL | 1 refills | Status: DC | PRN

## 2018-03-11 NOTE — Telephone Encounter (Signed)
Pt came in 03/11/18 gave samples of bonjesta

## 2018-03-11 NOTE — Progress Notes (Signed)
NEW OB HISTORY AND PHYSICAL  SUBJECTIVE:       Sheila Mendez is a 32 y.o. G1P0 female, Patient's last menstrual period was 12/09/2017., Estimated Date of Delivery: 09/15/18, 7122w1d, presents today for establishment of Prenatal Care.  Report nausea with vomiting and breast tenderness.   Denies difficulty breathing or respiratory distress, chest pain, abdominal pain, vaginal bleeding, dysuria, and leg pain or swelling.   Desires genetic screening. FOB present for today's exam.   Gynecologic History  Patient's last menstrual period was 12/09/2017.   Contraception: none   Last Pap: 2015. Results were: normal  Obstetric History  OB History  Gravida Para Term Preterm AB Living  2 1 1     1   SAB TAB Ectopic Multiple Live Births          1    # Outcome Date GA Lbr Len/2nd Weight Sex Delivery Anes PTL Lv  2 Current           1 Term 06/21/14     Vag-Spont   LIV    No past medical history on file.  No past surgical history on file.  Current Outpatient Medications on File Prior to Visit  Medication Sig Dispense Refill  . Prenatal Vit-Fe Fumarate-FA (PRENATAL MULTIVITAMIN) TABS tablet Take 1 tablet by mouth daily at 12 noon.    . promethazine (PHENERGAN) 25 MG tablet Take 1 tablet (25 mg total) by mouth every 6 (six) hours as needed for nausea or vomiting. 30 tablet 1   No current facility-administered medications on file prior to visit.     No Known Allergies  Social History   Socioeconomic History  . Marital status: Single    Spouse name: Not on file  . Number of children: Not on file  . Years of education: Not on file  . Highest education level: Not on file  Occupational History  . Not on file  Social Needs  . Financial resource strain: Not on file  . Food insecurity:    Worry: Not on file    Inability: Not on file  . Transportation needs:    Medical: Not on file    Non-medical: Not on file  Tobacco Use  . Smoking status: Never Smoker  . Smokeless tobacco:  Never Used  Substance and Sexual Activity  . Alcohol use: Yes    Comment: rarely  . Drug use: No  . Sexual activity: Never  Lifestyle  . Physical activity:    Days per week: Not on file    Minutes per session: Not on file  . Stress: Not on file  Relationships  . Social connections:    Talks on phone: Not on file    Gets together: Not on file    Attends religious service: Not on file    Active member of club or organization: Not on file    Attends meetings of clubs or organizations: Not on file    Relationship status: Not on file  . Intimate partner violence:    Fear of current or ex partner: Not on file    Emotionally abused: Not on file    Physically abused: Not on file    Forced sexual activity: Not on file  Other Topics Concern  . Not on file  Social History Narrative  . Not on file    No family history on file.  The following portions of the patient's history were reviewed and updated as appropriate: allergies, current medications, past OB history, past medical history,  past surgical history, past family history, past social history, and problem list.    OBJECTIVE:  BP 122/63   Pulse 84   Wt 188 lb 11.2 oz (85.6 kg)   LMP 12/09/2017   BMI 32.39 kg/m   Initial Physical Exam (New OB)  GENERAL APPEARANCE: alert, well appearing, in no apparent distress  HEAD: normocephalic, atraumatic  MOUTH: mucous membranes moist, pharynx normal without lesions  THYROID: no thyromegaly or masses present  BREASTS: not examined  LUNGS: clear to auscultation, no wheezes, rales or rhonchi, symmetric air entry  HEART: regular rate and rhythm, no murmurs  ABDOMEN: soft, nontender, nondistended, no abnormal masses, no epigastric pain and obese  EXTREMITIES: no redness or tenderness in the calves or thighs, no edema  SKIN: normal coloration and turgor, no rashes  LYMPH NODES: no adenopathy palpable  NEUROLOGIC: alert, oriented, normal speech, no focal findings or  movement disorder noted  PELVIC EXAM EXTERNAL GENITALIA: normal appearing vulva with no masses, tenderness or lesions VAGINA: no abnormal discharge or lesions CERVIX: no lesions or cervical motion tenderness; Pap collected  ULTRASOUND REPORT  Location: [facility] Date of Service:  03/11/2018  Indications: Dating/Viability Findings:  Mason Jim intrauterine pregnancy is visualized with a CRL consistent with 13 1/[redacted] weeks gestation, giving an (U/S) EDD of 09/15/18. A clinical EDD has not been stablished.  FHR: 162 BPM CRL measurement: 69.7 mm Early anatomy is normal. Placenta visualized and appears posterior.  Right Ovary measures 3.0 x 2.2 x 1.7 cm. It is normal in appearance. Left Ovary measures 2.8 x 2.1 x 1.9 cm. It is normal appearance. There is no obvious evidence of a corpus luteal cyst. Survey of the adnexa demonstrates no adnexal masses. There is no free peritoneal fluid in the cul de sac.  Impression: 1. 13 1/7 week Viable Singleton Intrauterine pregnancy by U/S. 2. A clinical EDD has not been established, so today's ultrasound should be used.  Recommendations: 1.Clinical correlation with the patient's History and Physical Exam. 2. Today's ultrasound should be used to establish EDD of 09/15/18.   ASSESSMENT: Normal pregnancy Desires genetic screening Rh negative BMI 32  PLAN: Prenatal care Panorama today New OB counseling: The patient has been given an overview regarding routine prenatal care. Recommendations regarding diet, weight gain, and exercise in pregnancy were given. Prenatal testing, optional genetic testing, and ultrasound use in pregnancy were reviewed.  Benefits of Breast Feeding were discussed. The patient is encouraged to consider nursing her baby post partum. See orders   Gunnar Bulla, CNM [facility], [facility]

## 2018-03-11 NOTE — Patient Instructions (Addendum)
Abdominal Pain During Pregnancy Belly (abdominal) pain is common during pregnancy. Most of the time, it is not a serious problem. Other times, it can be a sign that something is wrong with the pregnancy. Always tell your doctor if you have belly pain. Follow these instructions at home: Monitor your belly pain for any changes. The following actions may help you feel better:  Do not have sex (intercourse) or put anything in your vagina until you feel better.  Rest until your pain stops.  Drink clear fluids if you feel sick to your stomach (nauseous). Do not eat solid food until you feel better.  Only take medicine as told by your doctor.  Keep all doctor visits as told.  Get help right away if:  You are bleeding, leaking fluid, or pieces of tissue come out of your vagina.  You have more pain or cramping.  You keep throwing up (vomiting).  You have pain when you pee (urinate) or have blood in your pee.  You have a fever.  You do not feel your baby moving as much.  You feel very weak or feel like passing out.  You have trouble breathing, with or without belly pain.  You have a very bad headache and belly pain.  You have fluid leaking from your vagina and belly pain.  You keep having watery poop (diarrhea).  Your belly pain does not go away after resting, or the pain gets worse. This information is not intended to replace advice given to you by your health care provider. Make sure you discuss any questions you have with your health care provider. Document Released: 08/23/2009 Document Revised: 04/12/2016 Document Reviewed: 04/03/2013 Elsevier Interactive Patient Education  2018 [employer].  Back Pain in Pregnancy Back pain during pregnancy is common. Back pain may be caused by several factors that are related to changes during your pregnancy. Follow these instructions at home: Managing pain, stiffness, and swelling  If directed, apply ice for sudden (acute) back  pain. ? Put ice in a plastic bag. ? Place a towel between your skin and the bag. ? Leave the ice on for 20 minutes, 2-3 times per day.  If directed, apply heat to the affected area before you exercise: ? Place a towel between your skin and the heat pack or heating pad. ? Leave the heat on for 20-30 minutes. ? Remove the heat if your skin turns bright red. This is especially important if you are unable to feel pain, heat, or cold. You may have a greater risk of getting burned. Activity  Exercise as told by your health care provider. Exercising is the best way to prevent or manage back pain.  Listen to your body when lifting. If lifting hurts, ask for help or bend your knees. This uses your leg muscles instead of your back muscles.  Squat down when picking up something from the floor. Do not bend over.  Only use bed rest as told by your health care provider. Bed rest should only be used for the most severe episodes of back pain. Standing, Sitting, and Lying Down  Do not stand in one place for long periods of time.  Use good posture when sitting. Make sure your head rests over your shoulders and is not hanging forward. Use a pillow on your lower back if necessary.  Try sleeping on your side, preferably the left side, with a pillow or two between your legs. If you are sore after a night's rest,  your bed may be too soft. A firm mattress may provide more support for your back during pregnancy. General instructions  Do not wear high heels.  Eat a healthy diet. Try to gain weight within your health care provider's recommendations.  Use a maternity girdle, elastic sling, or back brace as told by your health care provider.  Take over-the-counter and prescription medicines only as told by your health care provider.  Keep all follow-up visits as told by your health care provider. This is important. This includes any visits with any specialists, such as a physical therapist. Contact a health  care provider if:  Your back pain interferes with your daily activities.  You have increasing pain in other parts of your body. Get help right away if:  You develop numbness, tingling, weakness, or problems with the use of your arms or legs.  You develop severe back pain that is not controlled with medicine.  You have a sudden change in bowel or bladder control.  You develop shortness of breath, dizziness, or you faint.  You develop nausea, vomiting, or sweating.  You have back pain that is a rhythmic, cramping pain similar to labor pains. Labor pain is usually 1-2 minutes apart, lasts for about 1 minute, and involves a bearing down feeling or pressure in your pelvis.  You have back pain and your water breaks or you have vaginal bleeding.  You have back pain or numbness that travels down your leg.  Your back pain developed after you fell.  You develop pain on one side of your back.  You see blood in your urine.  You develop skin blisters in the area of your back pain. This information is not intended to replace advice given to you by your health care provider. Make sure you discuss any questions you have with your health care provider. Document Released: 12/13/2005 Document Revised: 02/10/2016 Document Reviewed: 05/19/2015 Elsevier Interactive Patient Education  2018 [employer].  Morning Sickness Morning sickness is when you feel sick to your stomach (nauseous) during pregnancy. You may feel sick to your stomach and throw up (vomit). You may feel sick in the morning, but you can feel this way any time of day. Some women feel very sick to their stomach and cannot stop throwing up (hyperemesis gravidarum). Follow these instructions at home:  Only take medicines as told by your doctor.  Take multivitamins as told by your doctor. Taking multivitamins before getting pregnant can stop or lessen the harshness of morning sickness.  Eat dry toast or unsalted crackers before  getting out of bed.  Eat 5 to 6 small meals a day.  Eat dry and bland foods like rice and baked potatoes.  Do not drink liquids with meals. Drink between meals.  Do not eat greasy, fatty, or spicy foods.  Have someone cook for you if the smell of food causes you to feel sick or throw up.  If you feel sick to your stomach after taking prenatal vitamins, take them at night or with a snack.  Eat protein when you need a snack (nuts, yogurt, cheese).  Eat unsweetened gelatins for dessert.  Wear a bracelet used for sea sickness (acupressure wristband).  Go to a doctor that puts thin needles into certain body points (acupuncture) to improve how you feel.  Do not smoke.  Use a humidifier to keep the air in your house free of odors.  Get lots of fresh air. Contact a doctor if:  You need medicine to feel  better.  You feel dizzy or lightheaded.  You are losing weight. Get help right away if:  You feel very sick to your stomach and cannot stop throwing up.  You pass out (faint). This information is not intended to replace advice given to you by your health care provider. Make sure you discuss any questions you have with your health care provider. Document Released: 10/12/2004 Document Revised: 02/10/2016 Document Reviewed: 02/19/2013 Elsevier Interactive Patient Education  2017 [employer] WHAT OB PATIENTS CAN EXPECT   Confirmation of pregnancy and ultrasound ordered if medically indicated-[redacted] weeks gestation  New OB (NOB) intake with nurse and New OB (NOB) labs- [redacted] weeks gestation  New OB (NOB) physical examination with provider- 11/[redacted] weeks gestation  Flu vaccine-[redacted] weeks gestation  Anatomy scan-[redacted] weeks gestation  Glucose tolerance test, blood work to test for anemia, T-dap vaccine-[redacted] weeks gestation  Vaginal swabs/cultures-STD/Group B strep-[redacted] weeks gestation  Appointments every 4 weeks until 28 weeks  Every 2 weeks from 28 weeks until 36 weeks  Weekly visits  from 36 weeks until delivery   Eating Plan for Pregnant Women While you are pregnant, your body will require additional nutrition to help support your growing baby. It is recommended that you consume:  150 additional calories each day during your first trimester.  300 additional calories each day during your second trimester.  300 additional calories each day during your third trimester.  Eating a healthy, well-balanced diet is very important for your health and for your baby's health. You also have a higher need for some vitamins and minerals, such as folic acid, calcium, iron, and vitamin D. What do I need to know about eating during pregnancy?  Do not try to lose weight or go on a diet during pregnancy.  Choose healthy, nutritious foods. Choose  of a sandwich with a glass of milk instead of a candy bar or a high-calorie sugar-sweetened beverage.  Limit your overall intake of foods that have "empty calories." These are foods that have little nutritional value, such as sweets, desserts, candies, sugar-sweetened beverages, and fried foods.  Eat a variety of foods, especially fruits and vegetables.  Take a prenatal vitamin to help meet the additional needs during pregnancy, specifically for folic acid, iron, calcium, and vitamin D.  Remember to stay active. Ask your health care provider for exercise recommendations that are specific to you.  Practice good food safety and cleanliness, such as washing your hands before you eat and after you prepare raw meat. This helps to prevent foodborne illnesses, such as listeriosis, that can be very dangerous for your baby. Ask your health care provider for more information about listeriosis. What does 150 extra calories look like? Healthy options for an additional 150 calories each day could be any of the following:  Plain low-fat yogurt (6-8 oz) with  cup of berries.  1 apple with 2 teaspoons of peanut butter.  Cut-up vegetables with  cup of  hummus.  Low-fat chocolate milk (8 oz or 1 cup).  1 string cheese with 1 medium orange.   of a peanut butter and jelly sandwich on whole-wheat bread (1 tsp of peanut butter).  For 300 calories, you could eat two of those healthy options each day. What is a healthy amount of weight to gain? The recommended amount of weight for you to gain is based on your pre-pregnancy BMI. If your pre-pregnancy BMI was:  Less than 18 (underweight), you should gain 28-40 lb.  18-24.9 (normal), you should gain 25-35  lb.  25-29.9 (overweight), you should gain 15-25 lb.  Greater than 30 (obese), you should gain 11-20 lb.  What if I am having twins or multiples? Generally, pregnant women who will be having twins or multiples may need to increase their daily calories by 300-600 calories each day. The recommended range for total weight gain is 25-54 lb, depending on your pre-pregnancy BMI. Talk with your health care provider for specific guidance about additional nutritional needs, weight gain, and exercise during your pregnancy. What foods can I eat? Grains Any grains. Try to choose whole grains, such as whole-wheat bread, oatmeal, or brown rice. Vegetables Any vegetables. Try to eat a variety of colors and types of vegetables to get a full range of vitamins and minerals. Remember to wash your vegetables well before eating. Fruits Any fruits. Try to eat a variety of colors and types of fruit to get a full range of vitamins and minerals. Remember to wash your fruits well before eating. Meats and Other Protein Sources Lean meats, including chicken, [place], fish, and lean cuts of beef, veal, or pork. Make sure that all meats are cooked to "well done." Tofu. Tempeh. Beans. Eggs. Peanut butter and other nut butters. Seafood, such as shrimp, crab, and lobster. If you choose fish, select types that are higher in omega-3 fatty acids, including salmon, herring, mussels, trout, sardines, and pollock. Make sure that  all meats are cooked to food-safe temperatures. Dairy Pasteurized milk and milk alternatives. Pasteurized yogurt and pasteurized cheese. Cottage cheese. Sour cream. Beverages Water. Juices that contain 100% fruit juice or vegetable juice. Caffeine-free teas and decaffeinated coffee. Drinks that contain caffeine are okay to drink, but it is better to avoid caffeine. Keep your total caffeine intake to less than 200 mg each day (12 oz of coffee, tea, or soda) or as directed by your health care provider. Condiments Any pasteurized condiments. Sweets and Desserts Any sweets and desserts. Fats and Oils Any fats and oils. The items listed above may not be a complete list of recommended foods or beverages. Contact your dietitian for more options. What foods are not recommended? Vegetables Unpasteurized (raw) vegetable juices. Fruits Unpasteurized (raw) fruit juices. Meats and Other Protein Sources Cured meats that have nitrates, such as bacon, salami, and hotdogs. Luncheon meats, bologna, or other deli meats (unless they are reheated until they are steaming hot). Refrigerated pate, meat spreads from a meat counter, smoked seafood that is found in the refrigerated section of a store. Raw fish, such as sushi or sashimi. High mercury content fish, such as tilefish, shark, swordfish, and king mackerel. Raw meats, such as tuna or beef tartare. Undercooked meats and poultry. Make sure that all meats are cooked to food-safe temperatures. Dairy Unpasteurized (raw) milk and any foods that have raw milk in them. Soft cheeses, such as feta, queso blanco, queso fresco, Brie, Camembert cheeses, blue-veined cheeses, and Panela cheese (unless it is made with pasteurized milk, which must be stated on the label). Beverages Alcohol. Sugar-sweetened beverages, such as sodas, teas, or energy drinks. Condiments Homemade fermented foods and drinks, such as pickles, sauerkraut, or kombucha drinks. (Store-bought pasteurized  versions of these are okay.) Other Salads that are made in the store, such as ham salad, chicken salad, egg salad, tuna salad, and seafood salad. The items listed above may not be a complete list of foods and beverages to avoid. Contact your dietitian for more information. This information is not intended to replace advice given to you by your health care  provider. Make sure you discuss any questions you have with your health care provider. Document Released: 06/19/2014 Document Revised: 02/10/2016 Document Reviewed: 02/17/2014 Elsevier Interactive Patient Education  2018 [employer]. Common Medications Safe in Pregnancy  Acne:      Constipation:  Benzoyl Peroxide     Colace  Clindamycin      Dulcolax Suppository  Topica Erythromycin     Fibercon  Salicylic Acid      Metamucil         Miralax AVOID:        Senakot   Accutane    Cough:  Retin-A       Cough Drops  Tetracycline      Phenergan w/ Codeine if Rx  Minocycline      Robitussin (Plain & DM)  Antibiotics:     Crabs/Lice:  Ceclor       RID  Cephalosporins    AVOID:  E-Mycins      Kwell  Keflex  Macrobid/Macrodantin   Diarrhea:  Penicillin      Kao-Pectate  Zithromax      Imodium AD         PUSH FLUIDS AVOID:       Cipro     Fever:  Tetracycline      Tylenol (Regular or Extra  Minocycline       Strength)  Levaquin      Extra Strength-Do not          Exceed 8 tabs/24 hrs Caffeine:        <244m/day (equiv. To 1 cup of coffee or  approx. 3 12 oz sodas)         Gas: Cold/Hayfever:       Gas-X  Benadryl      Mylicon  Claritin       Phazyme  **Claritin-D        Chlor-Trimeton    Headaches:  Dimetapp      ASA-Free Excedrin  Drixoral-Non-Drowsy     Cold Compress  Mucinex (Guaifenasin)     Tylenol (Regular or Extra  Sudafed/Sudafed-12 Hour     Strength)  **Sudafed PE Pseudoephedrine   Tylenol Cold & Sinus     Vicks Vapor Rub  Zyrtec  **AVOID if Problems With Blood Pressure         Heartburn: Avoid lying  down for at least 1 hour after meals  Aciphex      Maalox     Rash:  Milk of Magnesia     Benadryl    Mylanta       1% Hydrocortisone Cream  Pepcid  Pepcid Complete   Sleep Aids:  Prevacid      Ambien   Prilosec       Benadryl  Rolaids       Chamomile Tea  Tums (Limit 4/day)     Unisom  Zantac       Tylenol PM         Warm milk-add vanilla or  Hemorrhoids:       Sugar for taste  Anusol/Anusol H.C.  (RX: Analapram 2.5%)  Sugar Substitutes:  Hydrocortisone OTC     Ok in moderation  Preparation H      Tucks        Vaseline lotion applied to tissue with wiping    Herpes:     Throat:  Acyclovir      Oragel  Famvir  Valtrex     Vaccines:         Flu  Shot Leg Cramps:       *Gardasil  Benadryl      Hepatitis A         Hepatitis B Nasal Spray:       Pneumovax  Saline Nasal Spray     Polio Booster         Tetanus Nausea:       Tuberculosis test or PPD  Vitamin B6 25 mg TID   AVOID:    Dramamine      *Gardasil  Emetrol       Live Poliovirus  Ginger Root 250 mg QID    MMR (measles, mumps &  High Complex Carbs @ Bedtime    rebella)  Sea Bands-Accupressure    Varicella (Chickenpox)  Unisom 1/2 tab TID     *No known complications           If received before Pain:         Known pregnancy;   Darvocet       Resume series after  Lortab        Delivery  Percocet    Yeast:   Tramadol      Femstat  Tylenol 3      Gyne-lotrimin  Ultram       Monistat  Vicodin           MISC:         All Sunscreens           Hair Coloring/highlights          Insect Repellant's          (Including DEET)         Mystic Tans Second Trimester of Pregnancy The second trimester is from week 13 through week 28, month 4 through 6. This is often the time in pregnancy that you feel your best. Often times, morning sickness has lessened or quit. You may have more energy, and you may get hungry more often. Your unborn baby (fetus) is growing rapidly. At the end of the sixth month, he or she is about 9 inches long  and weighs about 1 pounds. You will likely feel the baby move (quickening) between 18 and 20 weeks of pregnancy. Follow these instructions at home:  Avoid all smoking, herbs, and alcohol. Avoid drugs not approved by your doctor.  Do not use any tobacco products, including cigarettes, chewing tobacco, and electronic cigarettes. If you need help quitting, ask your doctor. You may get counseling or other support to help you quit.  Only take medicine as told by your doctor. Some medicines are safe and some are not during pregnancy.  Exercise only as told by your doctor. Stop exercising if you start having cramps.  Eat regular, healthy meals.  Wear a good support bra if your breasts are tender.  Do not use hot tubs, steam rooms, or saunas.  Wear your seat belt when driving.  Avoid raw meat, uncooked cheese, and liter boxes and soil used by cats.  Take your prenatal vitamins.  Take 1500-2000 milligrams of calcium daily starting at the 20th week of pregnancy until you deliver your baby.  Try taking medicine that helps you poop (stool softener) as needed, and if your doctor approves. Eat more fiber by eating fresh fruit, vegetables, and whole grains. Drink enough fluids to keep your pee (urine) clear or pale yellow.  Take warm water baths (sitz baths) to soothe pain or discomfort caused by hemorrhoids. Use hemorrhoid cream if your doctor approves.  If  you have puffy, bulging veins (varicose veins), wear support hose. Raise (elevate) your feet for 15 minutes, 3-4 times a day. Limit salt in your diet.  Avoid heavy lifting, wear low heals, and sit up straight.  Rest with your legs raised if you have leg cramps or low back pain.  Visit your dentist if you have not gone during your pregnancy. Use a soft toothbrush to brush your teeth. Be gentle when you floss.  You can have sex (intercourse) unless your doctor tells you not to.  Go to your doctor visits. Get help if:  You feel  dizzy.  You have mild cramps or pressure in your lower belly (abdomen).  You have a nagging pain in your belly area.  You continue to feel sick to your stomach (nauseous), throw up (vomit), or have watery poop (diarrhea).  You have bad smelling fluid coming from your vagina.  You have pain with peeing (urination). Get help right away if:  You have a fever.  You are leaking fluid from your vagina.  You have spotting or bleeding from your vagina.  You have severe belly cramping or pain.  You lose or gain weight rapidly.  You have trouble catching your breath and have chest pain.  You notice sudden or extreme puffiness (swelling) of your face, hands, ankles, feet, or legs.  You have not felt the baby move in over an hour.  You have severe headaches that do not go away with medicine.  You have vision changes. This information is not intended to replace advice given to you by your health care provider. Make sure you discuss any questions you have with your health care provider. Document Released: 11/29/2009 Document Revised: 02/10/2016 Document Reviewed: 11/05/2012 Elsevier Interactive Patient Education  2017 [employer].

## 2018-03-11 NOTE — Progress Notes (Signed)
NOB PE- pt came in for nob nurse intake, she had dating/viability today EDD 09/15/18

## 2018-03-12 ENCOUNTER — Encounter: Payer: Self-pay | Admitting: Certified Nurse Midwife

## 2018-03-12 LAB — URINALYSIS, ROUTINE W REFLEX MICROSCOPIC
Bilirubin, UA: NEGATIVE
GLUCOSE, UA: NEGATIVE
Nitrite, UA: NEGATIVE
PROTEIN UA: NEGATIVE
RBC, UA: NEGATIVE
SPEC GRAV UA: 1.028 (ref 1.005–1.030)
UUROB: 0.2 mg/dL (ref 0.2–1.0)
pH, UA: 5.5 (ref 5.0–7.5)

## 2018-03-12 LAB — MICROSCOPIC EXAMINATION: CASTS: NONE SEEN /LPF

## 2018-03-12 LAB — CBC WITH DIFFERENTIAL/PLATELET
Basophils Absolute: 0 10*3/uL (ref 0.0–0.2)
Basos: 0 %
EOS (ABSOLUTE): 0.2 10*3/uL (ref 0.0–0.4)
EOS: 2 %
HEMATOCRIT: 36.1 % (ref 34.0–46.6)
Hemoglobin: 12.3 g/dL (ref 11.1–15.9)
IMMATURE GRANULOCYTES: 0 %
Immature Grans (Abs): 0 10*3/uL (ref 0.0–0.1)
LYMPHS ABS: 2 10*3/uL (ref 0.7–3.1)
Lymphs: 25 %
MCH: 28.2 pg (ref 26.6–33.0)
MCHC: 34.1 g/dL (ref 31.5–35.7)
MCV: 83 fL (ref 79–97)
MONOCYTES: 7 %
Monocytes Absolute: 0.6 10*3/uL (ref 0.1–0.9)
Neutrophils Absolute: 5.2 10*3/uL (ref 1.4–7.0)
Neutrophils: 66 %
Platelets: 268 10*3/uL (ref 150–450)
RBC: 4.36 x10E6/uL (ref 3.77–5.28)
RDW: 13.9 % (ref 12.3–15.4)
WBC: 7.9 10*3/uL (ref 3.4–10.8)

## 2018-03-12 LAB — HEPATITIS B SURFACE ANTIGEN: Hepatitis B Surface Ag: NEGATIVE

## 2018-03-12 LAB — MONITOR DRUG PROFILE 14(MW)
AMPHETAMINE SCREEN URINE: NEGATIVE ng/mL
BARBITURATE SCREEN URINE: NEGATIVE ng/mL
BENZODIAZEPINE SCREEN, URINE: NEGATIVE ng/mL
Buprenorphine, Urine: NEGATIVE ng/mL
CANNABINOIDS UR QL SCN: NEGATIVE ng/mL
Cocaine (Metab) Scrn, Ur: NEGATIVE ng/mL
Creatinine(Crt), U: 289.7 mg/dL (ref 20.0–300.0)
Fentanyl, Urine: NEGATIVE pg/mL
METHADONE SCREEN, URINE: NEGATIVE ng/mL
Meperidine Screen, Urine: NEGATIVE ng/mL
OXYCODONE+OXYMORPHONE UR QL SCN: NEGATIVE ng/mL
Opiate Scrn, Ur: NEGATIVE ng/mL
PH UR, DRUG SCRN: 5.7 (ref 4.5–8.9)
PHENCYCLIDINE QUANTITATIVE URINE: NEGATIVE ng/mL
Propoxyphene Scrn, Ur: NEGATIVE ng/mL
SPECIFIC GRAVITY: 1.029
TRAMADOL SCREEN, URINE: NEGATIVE ng/mL

## 2018-03-12 LAB — GC/CHLAMYDIA PROBE AMP
Chlamydia trachomatis, NAA: NEGATIVE
Neisseria gonorrhoeae by PCR: NEGATIVE

## 2018-03-12 LAB — VARICELLA ZOSTER ANTIBODY, IGG: Varicella zoster IgG: 336 index (ref >165)

## 2018-03-12 LAB — ABO AND RH: Rh Factor: NEGATIVE

## 2018-03-12 LAB — TSH: TSH: 1.69 u[IU]/mL (ref 0.450–4.500)

## 2018-03-12 LAB — RUBELLA SCREEN: RUBELLA: 1.53 {index} (ref >0.99)

## 2018-03-12 LAB — HIV ANTIBODY (ROUTINE TESTING W REFLEX): HIV SCREEN 4TH GENERATION: NONREACTIVE

## 2018-03-12 LAB — HEMOGLOBIN A1C
ESTIMATED AVERAGE GLUCOSE: 103 mg/dL
HEMOGLOBIN A1C: 5.2 % (ref 4.8–5.6)

## 2018-03-12 LAB — RPR: RPR: NONREACTIVE

## 2018-03-12 LAB — ANTIBODY SCREEN: Antibody Screen: NEGATIVE

## 2018-03-13 LAB — URINE CULTURE

## 2018-03-14 ENCOUNTER — Telehealth: Payer: Self-pay

## 2018-03-14 LAB — PAPIG, HPV, RFX 16/18
HPV, high-risk: NEGATIVE
PAP Smear Comment: 0

## 2018-03-14 NOTE — Telephone Encounter (Signed)
Pt informed of MLs orders. Neg pap smear. Also encouraged to sign up for mychart.

## 2018-03-14 NOTE — Progress Notes (Signed)
Pap negative/negative. Encourage to activate MyChart. Thanks, JML

## 2018-04-08 ENCOUNTER — Encounter: Payer: Medicaid Other | Admitting: Certified Nurse Midwife

## 2018-04-09 ENCOUNTER — Ambulatory Visit (INDEPENDENT_AMBULATORY_CARE_PROVIDER_SITE_OTHER): Payer: Medicaid Other | Admitting: Certified Nurse Midwife

## 2018-04-09 VITALS — BP 110/57 | HR 104 | Wt 190.5 lb

## 2018-04-09 DIAGNOSIS — Z3492 Encounter for supervision of normal pregnancy, unspecified, second trimester: Secondary | ICD-10-CM | POA: Diagnosis not present

## 2018-04-09 LAB — POCT URINALYSIS DIPSTICK
BILIRUBIN UA: NEGATIVE
Blood, UA: NEGATIVE
GLUCOSE UA: NEGATIVE
Ketones, UA: NEGATIVE
Nitrite, UA: NEGATIVE
PH UA: 7 (ref 5.0–8.0)
Protein, UA: NEGATIVE
Spec Grav, UA: 1.015 (ref 1.010–1.025)
UROBILINOGEN UA: 0.2 U/dL

## 2018-04-09 NOTE — Progress Notes (Signed)
ROB- pt is doing well 

## 2018-04-09 NOTE — Progress Notes (Signed)
Body mass index is 32.7 kg/m. ROB, doing well. States that the nausea & vomiting have returned. It got better for a while and is now come back. She is using bonjesta that helps but makes her sleepy. She asks about medications for migraines. Discussed headaches in pregnancy, encouraged her to avoid triggers and to stay hydrated. Discussed use of Fioricet if headache is really debilitating her daily function. Reviewed benefits and risks of meds in pregnancy. She verbalizes understanding. She is managing at this time. Anticipatory guidance given for prenatal course and anatomy scan next visit. RTC with anatomy scan x3 weeks.  Shanika Creacy,SNM/Juddson Cobern,CNM

## 2018-04-09 NOTE — Patient Instructions (Signed)

## 2018-04-30 ENCOUNTER — Ambulatory Visit (INDEPENDENT_AMBULATORY_CARE_PROVIDER_SITE_OTHER): Payer: Medicaid Other

## 2018-04-30 ENCOUNTER — Ambulatory Visit (INDEPENDENT_AMBULATORY_CARE_PROVIDER_SITE_OTHER): Payer: Medicaid Other | Admitting: Certified Nurse Midwife

## 2018-04-30 VITALS — BP 95/54 | HR 86 | Wt 190.5 lb

## 2018-04-30 DIAGNOSIS — Z3482 Encounter for supervision of other normal pregnancy, second trimester: Secondary | ICD-10-CM

## 2018-04-30 DIAGNOSIS — Z363 Encounter for antenatal screening for malformations: Secondary | ICD-10-CM

## 2018-04-30 DIAGNOSIS — Z3492 Encounter for supervision of normal pregnancy, unspecified, second trimester: Secondary | ICD-10-CM

## 2018-04-30 MED ORDER — PROMETHAZINE HCL 25 MG PO TABS: 25.0000 mg | ORAL_TABLET | Freq: Four times a day (QID) | ORAL | 1 refills | Status: DC | PRN

## 2018-04-30 NOTE — Progress Notes (Signed)
ROB-Reports back pain. Discussed home treatment measures including use of abdominal support. Anatomy scan today normal, findings reviewed with patient. Naming female "[name]". Anticipatory guidance regarding course of prenatal care. Reviewed red flag symptoms and when to call. RTC x 4 weeks for ROB or sooner if needed.   ULTRASOUND REPORT  Location: [facility] Date of Service:  04/30/2018  Indications: Anatomy Findings:  Mason JimSingleton intrauterine pregnancy is visualized with FHR at 144 BPM. Biometrics give an (U/S) Gestational age of 34 1/7 weeks and an (U/S) EDD of 09/16/18; this correlates with the clinically established EDD of 09/15/18.  Fetal presentation is breech.  EFW: 341 grams (0lb 12oz). Placenta: Posterior and grade 1. AFI: WNL subjectively.  Anatomic survey is comoplete and appears WNL; Gender - Female.   Right Ovary measures 2.9 x 2.3 x 2.0 cm. It is normal in appearance. Left Ovary measures 2.6 x 2.2 x 1.9 cm. It is normal appearance. There is no obvious evidence of a corpus luteal cyst. Survey of the adnexa demonstrates no adnexal masses. There is no free peritoneal fluid in the cul de sac.  Impression: 1. 20 1/7 week Viable Singleton Intrauterine pregnancy by U/S. 2. (U/S) EDD is consistent with Clinically established (LMP) EDD of 09/15/18. 3. Normal Anatomy Scan  Recommendations: 1.Clinical correlation with the patient's History and Physical Exam.

## 2018-04-30 NOTE — Progress Notes (Signed)
Pt is here for an ROB visit. Had anatomy scan. 

## 2018-04-30 NOTE — Patient Instructions (Signed)
Rh Incompatibility Rh incompatibility is a condition that occurs during pregnancy if a woman has Rh-negative blood and her baby has Rh-positive blood. "Rh-negative" and "Rh-positive" refer to whether or not the blood has an Rh factor. An Rh factor is a specific protein found on the surface of red blood cells. If a woman has Rh factor, she is Rh-positive. If she does not have an Rh factor, she is Rh-negative. Having or not having an Rh factor does not affect the mother's general health. However, it can cause problems during pregnancy. What kind of problems can Rh incompatibility cause? During pregnancy, blood from the baby can cross into the mother's bloodstream, especially during delivery. If a mother is Rh-negative and the baby is Rh-positive, the mother's defense system will react to the baby's blood as if it was a foreign substance and will create proteins (antibodies). This is called sensitization. Once the mother is sensitized, her Rh antibodies will cross the placenta to the baby and attack the baby's Rh-positive blood as if it is a harmful substance. Rh incompatibility can also happen if the Rh-negative pregnant woman is exposed to the Rh factor during a blood transfusion with Rh-positive blood. How does this condition affect my baby? The Rh antibodies that attack and destroy the baby's red blood cells can lead to hemolytic disease in the baby. Hemolytic disease is when the red blood cells break down. This can cause:  Yellowing of the skin and eyes (jaundice).  The body to not have enough healthy red blood cells (anemia).  Brain damage.  Heart failure.  Death.  These antibodies usually do not cause problems during a first pregnancy. This is because the blood from the baby often times crosses into the mother's bloodstream during delivery, and the baby is born before many of the antibodies can develop. However, the antibodies stay in your body once they have formed. Because of this, Rh  incompatibility is more likely to cause problems in second or later pregnancies (if the baby is Rh-positive). How is this diagnosed? When a woman becomes pregnant, blood tests may be done to find out her blood type and Rh factor. If the woman is Rh-negative, she also may have another blood test called an antibody screen. The antibody screen shows whether she has Rh antibodies in her blood. If she does, it means she was exposed to Rh-positive blood before, and she is at risk for Rh incompatibility. To find out whether the baby is developing hemolytic anemia and how serious it is, caregivers may use more advanced tests, such as ultrasonography (commonly known as ultrasound). How is Rh incompatibility treated? Rh incompatibility is treated with a shot of medicine called Rho (D) immune globulin. This medicine keeps the woman's body from making antibodies that can cause serious problems in the baby or future babies. Two shots will be given, one at around your seventh month of pregnancy and the other within 72 hours of your baby being born. If you are Rh-negative, you will need this medicine every time you have a baby with Rh-positive blood. If you already have antibodies in your blood, Rho (D) immune globulin will not help. Your doctor will not give you this medicine, but will watch your pregnancy closely for problems instead. This shot may also be given to an Rh-negative woman when the risk of blood transfer between the mom and baby is high. The risk is high with:  An amniocentesis.  A miscarriage or an abortion.  An ectopic pregnancy.  Any  vaginal bleeding during pregnancy.  This information is not intended to replace advice given to you by your health care provider. Make sure you discuss any questions you have with your health care provider. Document Released: 02/24/2002 Document Revised: 02/10/2016 Document Reviewed: 12/17/2012 Elsevier Interactive Patient Education  2017 [place]. Common  Medications Safe in Pregnancy  Acne:      Constipation:  Benzoyl Peroxide     Colace  Clindamycin      Dulcolax Suppository  Topica Erythromycin     Fibercon  Salicylic Acid      Metamucil         Miralax AVOID:        Senakot   Accutane    Cough:  Retin-A       Cough Drops  Tetracycline      Phenergan w/ Codeine if Rx  Minocycline      Robitussin (Plain & DM)  Antibiotics:     Crabs/Lice:  Ceclor       RID  Cephalosporins    AVOID:  E-Mycins      Kwell  Keflex  Macrobid/Macrodantin   Diarrhea:  Penicillin      Kao-Pectate  Zithromax      Imodium AD         PUSH FLUIDS AVOID:       Cipro     Fever:  Tetracycline      Tylenol (Regular or Extra  Minocycline       Strength)  Levaquin      Extra Strength-Do not          Exceed 8 tabs/24 hrs Caffeine:        <288m/day (equiv. To 1 cup of coffee or  approx. 3 12 oz sodas)         Gas: Cold/Hayfever:       Gas-X  Benadryl      Mylicon  Claritin       Phazyme  **Claritin-D        Chlor-Trimeton    Headaches:  Dimetapp      ASA-Free Excedrin  Drixoral-Non-Drowsy     Cold Compress  Mucinex (Guaifenasin)     Tylenol (Regular or Extra  Sudafed/Sudafed-12 Hour     Strength)  **Sudafed PE Pseudoephedrine   Tylenol Cold & Sinus     Vicks Vapor Rub  Zyrtec  **AVOID if Problems With Blood Pressure         Heartburn: Avoid lying down for at least 1 hour after meals  Aciphex      Maalox     Rash:  Milk of Magnesia     Benadryl    Mylanta       1% Hydrocortisone Cream  Pepcid  Pepcid Complete   Sleep Aids:  Prevacid      Ambien   Prilosec       Benadryl  Rolaids       Chamomile Tea  Tums (Limit 4/day)     Unisom  Zantac       Tylenol PM         Warm milk-add vanilla or  Hemorrhoids:       Sugar for taste  Anusol/Anusol H.C.  (RX: Analapram 2.5%)  Sugar Substitutes:  Hydrocortisone OTC     Ok in moderation  Preparation H      Tucks        Vaseline lotion applied to tissue with  wiping    Herpes:     Throat:  Acyclovir  Oragel  Famvir  Valtrex     Vaccines:         Flu Shot Leg Cramps:       *Gardasil  Benadryl      Hepatitis A         Hepatitis B Nasal Spray:       Pneumovax  Saline Nasal Spray     Polio Booster         Tetanus Nausea:       Tuberculosis test or PPD  Vitamin B6 25 mg TID   AVOID:    Dramamine      *Gardasil  Emetrol       Live Poliovirus  Ginger Root 250 mg QID    MMR (measles, mumps &  High Complex Carbs @ Bedtime    rebella)  Sea Bands-Accupressure    Varicella (Chickenpox)  Unisom 1/2 tab TID     *No known complications           If received before Pain:         Known pregnancy;   Darvocet       Resume series after  Lortab        Delivery  Percocet    Yeast:   Tramadol      Femstat  Tylenol 3      Gyne-lotrimin  Ultram       Monistat  Vicodin           MISC:         All Sunscreens           Hair Coloring/highlights          Insect Repellant's          (Including DEET)         Mystic Tans Round Ligament Pain The round ligament is a cord of muscle and tissue that helps to support the uterus. It can become a source of pain during pregnancy if it becomes stretched or twisted as the baby grows. The pain usually begins in the second trimester of pregnancy, and it can come and go until the baby is delivered. It is not a serious problem, and it does not cause harm to the baby. Round ligament pain is usually a short, sharp, and pinching pain, but it can also be a dull, lingering, and aching pain. The pain is felt in the lower side of the abdomen or in the groin. It usually starts deep in the groin and moves up to the outside of the hip area. Pain can occur with:  A sudden change in position.  Rolling over in bed.  Coughing or sneezing.  Physical activity.  Follow these instructions at home: Watch your condition for any changes. Take these steps to help with your pain:  When the pain starts, relax. Then try: ? Sitting  down. ? Flexing your knees up to your abdomen. ? Lying on your side with one pillow under your abdomen and another pillow between your legs. ? Sitting in a warm bath for 15-20 minutes or until the pain goes away.  Take over-the-counter and prescription medicines only as told by your health care provider.  Move slowly when you sit and stand.  Avoid long walks if they cause pain.  Stop or lessen your physical activities if they cause pain.  Contact a health care provider if:  Your pain does not go away with treatment.  You feel pain in your back that you did not have before.  Your medicine  is not helping. Get help right away if:  You develop a fever or chills.  You develop uterine contractions.  You develop vaginal bleeding.  You develop nausea or vomiting.  You develop diarrhea.  You have pain when you urinate. This information is not intended to replace advice given to you by your health care provider. Make sure you discuss any questions you have with your health care provider. Document Released: 06/13/2008 Document Revised: 02/10/2016 Document Reviewed: 11/11/2014 Elsevier Interactive Patient Education  2018 [place]. Back Pain in Pregnancy Back pain during pregnancy is common. Back pain may be caused by several factors that are related to changes during your pregnancy. Follow these instructions at home: Managing pain, stiffness, and swelling  If directed, apply ice for sudden (acute) back pain. ? Put ice in a plastic bag. ? Place a towel between your skin and the bag. ? Leave the ice on for 20 minutes, 2-3 times per day.  If directed, apply heat to the affected area before you exercise: ? Place a towel between your skin and the heat pack or heating pad. ? Leave the heat on for 20-30 minutes. ? Remove the heat if your skin turns bright red. This is especially important if you are unable to feel pain, heat, or cold. You may have a greater risk of getting  burned. Activity  Exercise as told by your health care provider. Exercising is the best way to prevent or manage back pain.  Listen to your body when lifting. If lifting hurts, ask for help or bend your knees. This uses your leg muscles instead of your back muscles.  Squat down when picking up something from the floor. Do not bend over.  Only use bed rest as told by your health care provider. Bed rest should only be used for the most severe episodes of back pain. Standing, Sitting, and Lying Down  Do not stand in one place for long periods of time.  Use good posture when sitting. Make sure your head rests over your shoulders and is not hanging forward. Use a pillow on your lower back if necessary.  Try sleeping on your side, preferably the left side, with a pillow or two between your legs. If you are sore after a night's rest, your bed may be too soft. A firm mattress may provide more support for your back during pregnancy. General instructions  Do not wear high heels.  Eat a healthy diet. Try to gain weight within your health care provider's recommendations.  Use a maternity girdle, elastic sling, or back brace as told by your health care provider.  Take over-the-counter and prescription medicines only as told by your health care provider.  Keep all follow-up visits as told by your health care provider. This is important. This includes any visits with any specialists, such as a physical therapist. Contact a health care provider if:  Your back pain interferes with your daily activities.  You have increasing pain in other parts of your body. Get help right away if:  You develop numbness, tingling, weakness, or problems with the use of your arms or legs.  You develop severe back pain that is not controlled with medicine.  You have a sudden change in bowel or bladder control.  You develop shortness of breath, dizziness, or you faint.  You develop nausea, vomiting, or  sweating.  You have back pain that is a rhythmic, cramping pain similar to labor pains. Labor pain is usually 1-2 minutes apart, lasts  for about 1 minute, and involves a bearing down feeling or pressure in your pelvis.  You have back pain and your water breaks or you have vaginal bleeding.  You have back pain or numbness that travels down your leg.  Your back pain developed after you fell.  You develop pain on one side of your back.  You see blood in your urine.  You develop skin blisters in the area of your back pain. This information is not intended to replace advice given to you by your health care provider. Make sure you discuss any questions you have with your health care provider. Document Released: 12/13/2005 Document Revised: 02/10/2016 Document Reviewed: 05/19/2015 Elsevier Interactive Patient Education  [employer].

## 2018-05-06 ENCOUNTER — Telehealth: Payer: Self-pay | Admitting: Certified Nurse Midwife

## 2018-05-06 NOTE — Telephone Encounter (Signed)
The patient put in a request x2 for the belly band and is wondering if she can get an update please b/c she has not heard anything back from the website order.  She is asking for the nurse or provider to contact her at [phone], please advise, thanks.

## 2018-05-16 ENCOUNTER — Ambulatory Visit (INDEPENDENT_AMBULATORY_CARE_PROVIDER_SITE_OTHER): Payer: Medicaid Other | Admitting: Certified Nurse Midwife

## 2018-05-16 VITALS — BP 116/74 | HR 98 | Wt 189.6 lb

## 2018-05-16 DIAGNOSIS — Z3A22 22 weeks gestation of pregnancy: Secondary | ICD-10-CM

## 2018-05-16 DIAGNOSIS — Z3482 Encounter for supervision of other normal pregnancy, second trimester: Secondary | ICD-10-CM

## 2018-05-16 DIAGNOSIS — O9989 Other specified diseases and conditions complicating pregnancy, childbirth and the puerperium: Secondary | ICD-10-CM

## 2018-05-16 DIAGNOSIS — R21 Rash and other nonspecific skin eruption: Secondary | ICD-10-CM

## 2018-05-16 LAB — POCT URINALYSIS DIPSTICK OB
BILIRUBIN UA: NEGATIVE
GLUCOSE, UA: NEGATIVE
Leukocytes, UA: NEGATIVE
Nitrite, UA: NEGATIVE
POC,PROTEIN,UA: NEGATIVE
RBC UA: NEGATIVE
Spec Grav, UA: 1.025 (ref 1.010–1.025)
Urobilinogen, UA: 0.2 E.U./dL
pH, UA: 5 (ref 5.0–8.0)

## 2018-05-16 MED ORDER — TRIAMCINOLONE ACETONIDE 0.5 % EX OINT: 1.0000 "application " | TOPICAL_OINTMENT | Freq: Two times a day (BID) | CUTANEOUS | 0 refills | Status: DC

## 2018-05-16 MED ORDER — HYDROXYZINE HCL 25 MG PO TABS: 25.0000 mg | ORAL_TABLET | Freq: Four times a day (QID) | ORAL | 2 refills | Status: DC | PRN

## 2018-05-16 NOTE — Progress Notes (Signed)
Subjective:   Sheila Mendez is a 32 y.o. G2P1001 9876w4d being seen today for work in problem obstetrical visit.  Patient reports small itchy rash to feet, lower legs and upper abdomen, no relief with home treatment measures.    Denies contractions, vaginal bleeding or leaking of fluid.  Reports good fetal movement.  The following portions of the patient's history were reviewed and updated as appropriate: allergies, current medications, past family history, past medical history, past social history, past surgical history and problem list.   Review of Systems  ROS negative except as noted above. Information obtained from patient.   Objective:  BP 116/74   Pulse 98   Wt 189 lb 9 oz (86 kg)   LMP 12/09/2017   BMI 32.54 kg/m   FHT: Fetal Heart Rate (bpm): 145  Uterine Size: Fundal Height: 23 cm  Fetal Movement: Movement: Present    Abdomen:  soft, gravid, appropriate for gestational age,non-tender  Lower extremities:  several small, pin tip sized raised red dots dispersed unevenly across skin   Results for orders placed or performed in visit on 05/16/18 (from the past 24 hour(s))  POC Urinalysis Dipstick OB     Status: None   Collection Time: 05/16/18  1:46 PM  Result Value Ref Range   Color, UA yellow    Clarity, UA clear    Glucose, UA Negative Negative   Bilirubin, UA neg    Ketones, UA mod    Spec Grav, UA 1.025 1.010 - 1.025   Blood, UA neg    pH, UA 5.0 5.0 - 8.0   POC Protein UA Negative Negative, Trace   Urobilinogen, UA 0.2 0.2 or 1.0 E.U./dL   Nitrite, UA neg    Leukocytes, UA Negative Negative   Appearance     Odor      Assessment:   Pregnancy:  G2P1001 at 176w4d  1. Encounter for supervision of other normal pregnancy in second trimester  - POC Urinalysis Dipstick OB  2. Rash  Plan:   Rx: Vistaril and [place], see orders.   Discussed skin changes in pregnancy; see AVS.   Preterm labor symptoms: vaginal bleeding, contractions and leaking of fluid  reviewed in detail.  Fetal movement precautions reviewed.  Follow up in 1 week as previously scheduled or sooner if needed.   Gunnar BullaJenkins Michelle Latrise Bowland, CNM [facility]

## 2018-05-16 NOTE — Progress Notes (Signed)
Pt is here with c/o red spots that itch beginning in lower extremities and is spreading. Also states she has a heart murmer she is concerned with.

## 2018-05-16 NOTE — Patient Instructions (Signed)
Skin Conditions During Pregnancy Pregnancy affects many parts of your body. One part is your skin. Most skin problems that develop during pregnancy are not serious and are considered a normal part of pregnancy. They go away on their own after the baby is born. Other skin problems may need treatment. What type of skin problems can develop during pregnancy?  Stretch marks. Stretch marks are purple or pink lines on the skin. They may appear on the belly, breasts, thighs, or buttocks. Stretch marks are caused by weight gain that causes the skin to stretch. Stretch marks do not cause problems. Almost all women get them during pregnancy.  Darkening of the skin (hyperpigmentation). The darkening may occur in patches or as a line. Patches may appear on the face, nipples, or genital area. Lines often stretch from the belly button to the pubic area. Hyperpigmentation develops in almost all pregnant women. It is more severe in women with a dark complexion.  Spider angiomas. These are tiny pink or red lines that go out from a center point, like the legs of a spider. Usually, they are on the face, neck, and arms. They do not cause problems. They are most common in women with light complexions.  Palmar erythema. This is a reddening of the palms. It is most common in women with light complexions.  Swelling and redness. This can occur on the face, eyelids, fingers, or toes.  Pruritic urticarial papules and plaques of pregnancy (PUPPP). This is a rash that is itchy, red, and has tiny blisters. The cause is unknown. It usually starts on the abdomen and may affect the arms or legs. It does not affect the face. It usually begins later in pregnancy. About a third of all pregnant women develop this condition. There are no associated problems to the fetus with this rash. Sometimes, oral steroids are used to calm down the itch. The rash clears after the baby is born.  Prurigo of pregnancy. This is a disease in which red  patches and bumps appear on the arms and legs. The cause is unknown. The patches and bumps clear after the baby is born. About a third of pregnant women develop this disease.  Acne. Pimples may develop, including in women who have had clear skin for a long time.  Skin tags. These are small flaps of skin that stick out from the body. They may grow or become darker during pregnancy. They are usually harmless.  Moles. These are flat or slightly raised growths. They are usually round and pink or brown. They may grow or become darker during pregnancy.  Intrahepatic cholestasis of pregnancy. This is a rare condition that causes itchy skin. It may run in families. It increases the risk of complications for the fetus. This condition usually resolves after delivery. It can recur with subsequent pregnancies.  Impetigo herpetiformis. This is a form of a severe skin disease called pustular psoriasis. Usually, delivery is the only method of resolving the condition.  Pruritic folliculitis of pregnancy. This is a rare condition that causes pimple-like skin growths. It develops in the middle or later stages of pregnancy. Its cause is unknown.It usually resolves 2-3 weeks after delivery.  Pemphigoid gestationis. This is a very rare autoimmune disease. It causes a severely itchy rash and blisters. The rash does not appear on the face, scalp, or inside of the mouth. It usually resolves 3 months after delivery. It may recur with subsequent pregnancies. Some pre-existing skin conditions, such as atopic dermatitis, may become worse   during pregnancy. Follow these instructions at home: Different conditions may have different instructions. In general:  Follow all your health care provider's directions about medicines to treat skin problems while you are pregnant. Do not use any over-the-counter medicines (including medicated creams and lotions) until you have checked with your health care provider. Many medicines are not  safe to use when you are pregnant.  Avoid time in the sun. This will help keep your skin from darkening. When you must be outside, use sunscreen and wear a hat with a wide brim to protect your face. The sunscreen should have a SPF of at least 15. This may help limit dark spots that develop when the skin is exposed to the sun.  To avoid problems from stretched skin: ? Do not sit or stand for long periods of time. ? Exercise regularly. This helps keep your skin in good condition.  Use a gentle soap. This helps prevent acne.  Do not get too hot or too sweaty. This makes some skin rashes worse.  Wear loose clothes made of a soft fabric. This prevents skin irritation.  For itching, add oatmeal or cornstarch to your bathwater.  Use a skin moisturizer. Ask your health care provider for suggestions.  This information is not intended to replace advice given to you by your health care provider. Make sure you discuss any questions you have with your health care provider. Document Released: 10/07/2010 Document Revised: 02/10/2016 Document Reviewed: 06/16/2013 Elsevier Interactive Patient Education  2018 [employer]  

## 2018-05-24 ENCOUNTER — Ambulatory Visit (INDEPENDENT_AMBULATORY_CARE_PROVIDER_SITE_OTHER): Payer: Medicaid Other | Admitting: Certified Nurse Midwife

## 2018-05-24 VITALS — BP 117/66 | HR 83 | Wt 192.0 lb

## 2018-05-24 DIAGNOSIS — Z3492 Encounter for supervision of normal pregnancy, unspecified, second trimester: Secondary | ICD-10-CM

## 2018-05-24 DIAGNOSIS — Z131 Encounter for screening for diabetes mellitus: Secondary | ICD-10-CM

## 2018-05-24 DIAGNOSIS — Z23 Encounter for immunization: Secondary | ICD-10-CM | POA: Diagnosis not present

## 2018-05-24 DIAGNOSIS — Z130 Encounter for screening for diseases of the blood and blood-forming organs and certain disorders involving the immune mechanism: Secondary | ICD-10-CM

## 2018-05-24 DIAGNOSIS — Z113 Encounter for screening for infections with a predominantly sexual mode of transmission: Secondary | ICD-10-CM

## 2018-05-24 LAB — POCT URINALYSIS DIPSTICK OB
Bilirubin, UA: NEGATIVE
Glucose, UA: NEGATIVE
Ketones, UA: NEGATIVE
LEUKOCYTES UA: NEGATIVE
NITRITE UA: NEGATIVE
PH UA: 6.5 (ref 5.0–8.0)
PROTEIN: NEGATIVE
RBC UA: NEGATIVE
Spec Grav, UA: 1.01 (ref 1.010–1.025)
UROBILINOGEN UA: 0.2 U/dL

## 2018-05-24 NOTE — Progress Notes (Signed)
ROB- pt is doing well, flu vaccine given 

## 2018-05-24 NOTE — Patient Instructions (Signed)
WHAT OB PATIENTS CAN EXPECT   Confirmation of pregnancy and ultrasound ordered if medically indicated-[redacted] weeks gestation  New OB (NOB) intake with nurse and New OB (NOB) labs- [redacted] weeks gestation  New OB (NOB) physical examination with provider- 11/[redacted] weeks gestation  Flu vaccine-[redacted] weeks gestation  Anatomy scan-[redacted] weeks gestation  Glucose tolerance test, blood work to test for anemia, T-dap vaccine-[redacted] weeks gestation  Vaginal swabs/cultures-STD/Group B strep-[redacted] weeks gestation  Appointments every 4 weeks until 28 weeks  Every 2 weeks from 28 weeks until 36 weeks  Weekly visits from 36 weeks until delivery  Common Medications Safe in Pregnancy  Acne:      Constipation:  Benzoyl Peroxide     Colace  Clindamycin      Dulcolax Suppository  Topica Erythromycin     Fibercon  Salicylic Acid      Metamucil         Miralax AVOID:        Senakot   Accutane    Cough:  Retin-A       Cough Drops  Tetracycline      Phenergan w/ Codeine if Rx  Minocycline      Robitussin (Plain & DM)  Antibiotics:     Crabs/Lice:  Ceclor       RID  Cephalosporins    AVOID:  E-Mycins      Kwell  Keflex  Macrobid/Macrodantin   Diarrhea:  Penicillin      Kao-Pectate  Zithromax      Imodium AD         PUSH FLUIDS AVOID:       Cipro     Fever:  Tetracycline      Tylenol (Regular or Extra  Minocycline       Strength)  Levaquin      Extra Strength-Do not          Exceed 8 tabs/24 hrs Caffeine:        <253m/day (equiv. To 1 cup of coffee or  approx. 3 12 oz sodas)         Gas: Cold/Hayfever:       Gas-X  Benadryl      Mylicon  Claritin       Phazyme  **Claritin-D        Chlor-Trimeton    Headaches:  Dimetapp      ASA-Free Excedrin  Drixoral-Non-Drowsy     Cold Compress  Mucinex (Guaifenasin)     Tylenol (Regular or Extra  Sudafed/Sudafed-12 Hour     Strength)  **Sudafed PE Pseudoephedrine   Tylenol Cold & Sinus     Vicks Vapor Rub  Zyrtec  **AVOID if Problems With Blood  Pressure         Heartburn: Avoid lying down for at least 1 hour after meals  Aciphex      Maalox     Rash:  Milk of Magnesia     Benadryl    Mylanta       1% Hydrocortisone Cream  Pepcid  Pepcid Complete   Sleep Aids:  Prevacid      Ambien   Prilosec       Benadryl  Rolaids       Chamomile Tea  Tums (Limit 4/day)     Unisom  Zantac       Tylenol PM         Warm milk-add vanilla or  Hemorrhoids:       Sugar for taste  Anusol/Anusol H.C.  (RX: Analapram 2.5%)  Sugar Substitutes:  Hydrocortisone OTC     Ok in moderation  Preparation H      Tucks        Vaseline lotion applied to tissue with wiping    Herpes:     Throat:  Acyclovir      Oragel  Famvir  Valtrex     Vaccines:         Flu Shot Leg Cramps:       *Gardasil  Benadryl      Hepatitis A         Hepatitis B Nasal Spray:       Pneumovax  Saline Nasal Spray     Polio Booster         Tetanus Nausea:       Tuberculosis test or PPD  Vitamin B6 25 mg TID   AVOID:    Dramamine      *Gardasil  Emetrol       Live Poliovirus  Ginger Root 250 mg QID    MMR (measles, mumps &  High Complex Carbs @ Bedtime    rebella)  Sea Bands-Accupressure    Varicella (Chickenpox)  Unisom 1/2 tab TID     *No known complications           If received before Pain:         Known pregnancy;   Darvocet       Resume series after  Lortab        Delivery  Percocet    Yeast:   Tramadol      Femstat  Tylenol 3      Gyne-lotrimin  Ultram       Monistat  Vicodin           MISC:         All Sunscreens           Hair Coloring/highlights          Insect Repellant's          (Including DEET)         Mystic Tans Third Trimester of Pregnancy The third trimester is from week 29 through week 42, months 7 through 9. This trimester is when your unborn baby (fetus) is growing very fast. At the end of the ninth month, the unborn baby is about 20 inches in length. It weighs about 6-10 pounds. Follow these instructions at home:  Avoid all smoking,  herbs, and alcohol. Avoid drugs not approved by your doctor.  Do not use any tobacco products, including cigarettes, chewing tobacco, and electronic cigarettes. If you need help quitting, ask your doctor. You may get counseling or other support to help you quit.  Only take medicine as told by your doctor. Some medicines are safe and some are not during pregnancy.  Exercise only as told by your doctor. Stop exercising if you start having cramps.  Eat regular, healthy meals.  Wear a good support bra if your breasts are tender.  Do not use hot tubs, steam rooms, or saunas.  Wear your seat belt when driving.  Avoid raw meat, uncooked cheese, and liter boxes and soil used by cats.  Take your prenatal vitamins.  Take 1500-2000 milligrams of calcium daily starting at the 20th week of pregnancy until you deliver your baby.  Try taking medicine that helps you poop (stool softener) as needed, and if your doctor approves. Eat more fiber by eating fresh fruit, vegetables, and whole grains. Drink enough fluids to keep your pee (  urine) clear or pale yellow.  Take warm water baths (sitz baths) to soothe pain or discomfort caused by hemorrhoids. Use hemorrhoid cream if your doctor approves.  If you have puffy, bulging veins (varicose veins), wear support hose. Raise (elevate) your feet for 15 minutes, 3-4 times a day. Limit salt in your diet.  Avoid heavy lifting, wear low heels, and sit up straight.  Rest with your legs raised if you have leg cramps or low back pain.  Visit your dentist if you have not gone during your pregnancy. Use a soft toothbrush to brush your teeth. Be gentle when you floss.  You can have sex (intercourse) unless your doctor tells you not to.  Do not travel far distances unless you must. Only do so with your doctor's approval.  Take prenatal classes.  Practice driving to the hospital.  Pack your hospital bag.  Prepare the baby's room.  Go to your doctor  visits. Get help if:  You are not sure if you are in labor or if your water has broken.  You are dizzy.  You have mild cramps or pressure in your lower belly (abdominal).  You have a nagging pain in your belly area.  You continue to feel sick to your stomach (nauseous), throw up (vomit), or have watery poop (diarrhea).  You have bad smelling fluid coming from your vagina.  You have pain with peeing (urination). Get help right away if:  You have a fever.  You are leaking fluid from your vagina.  You are spotting or bleeding from your vagina.  You have severe belly cramping or pain.  You lose or gain weight rapidly.  You have trouble catching your breath and have chest pain.  You notice sudden or extreme puffiness (swelling) of your face, hands, ankles, feet, or legs.  You have not felt the baby move in over an hour.  You have severe headaches that do not go away with medicine.  You have vision changes. This information is not intended to replace advice given to you by your health care provider. Make sure you discuss any questions you have with your health care provider. Document Released: 11/29/2009 Document Revised: 02/10/2016 Document Reviewed: 11/05/2012 Elsevier Interactive Patient Education  2017 [employer].

## 2018-05-26 NOTE — Progress Notes (Signed)
ROB-Doing well, rash is getting better. Anticipatory guidance regarding course of prenatal care. Flu vaccine given. Desires epidural. [facility] [employer] given. Plans formula feeding. Reviewed red flag symptoms and when to call. RTC x 4 weeks for 28 week labs and ROB or sooner if needed.

## 2018-05-31 ENCOUNTER — Encounter: Payer: Medicaid Other | Admitting: Certified Nurse Midwife

## 2018-06-21 ENCOUNTER — Other Ambulatory Visit: Payer: Medicaid Other

## 2018-06-21 ENCOUNTER — Ambulatory Visit (INDEPENDENT_AMBULATORY_CARE_PROVIDER_SITE_OTHER): Payer: Medicaid Other | Admitting: Obstetrics and Gynecology

## 2018-06-21 VITALS — BP 87/67 | HR 91 | Wt 194.9 lb

## 2018-06-21 DIAGNOSIS — Z23 Encounter for immunization: Secondary | ICD-10-CM

## 2018-06-21 DIAGNOSIS — Z3492 Encounter for supervision of normal pregnancy, unspecified, second trimester: Secondary | ICD-10-CM

## 2018-06-21 DIAGNOSIS — Z3A27 27 weeks gestation of pregnancy: Secondary | ICD-10-CM

## 2018-06-21 DIAGNOSIS — Z113 Encounter for screening for infections with a predominantly sexual mode of transmission: Secondary | ICD-10-CM

## 2018-06-21 DIAGNOSIS — Z130 Encounter for screening for diseases of the blood and blood-forming organs and certain disorders involving the immune mechanism: Secondary | ICD-10-CM

## 2018-06-21 DIAGNOSIS — O360120 Maternal care for anti-D [Rh] antibodies, second trimester, not applicable or unspecified: Secondary | ICD-10-CM | POA: Diagnosis not present

## 2018-06-21 DIAGNOSIS — Z131 Encounter for screening for diabetes mellitus: Secondary | ICD-10-CM

## 2018-06-21 LAB — POCT URINALYSIS DIPSTICK OB
BILIRUBIN UA: NEGATIVE
Ketones, UA: NEGATIVE
NITRITE UA: NEGATIVE
RBC UA: NEGATIVE
Spec Grav, UA: 1.01 (ref 1.010–1.025)
UROBILINOGEN UA: 0.2 U/dL
pH, UA: 6 (ref 5.0–8.0)

## 2018-06-21 MED ORDER — RHO D IMMUNE GLOBULIN 1500 UNITS IM SOSY
1500.0000 [IU] | PREFILLED_SYRINGE | Freq: Once | INTRAMUSCULAR | Status: AC
  Administered 2018-06-21: 1500 [IU] via INTRAMUSCULAR

## 2018-06-21 MED ORDER — TETANUS-DIPHTH-ACELL PERTUSSIS 5-2.5-18.5 LF-MCG/0.5 IM SUSP
0.5000 mL | Freq: Once | INTRAMUSCULAR | Status: AC
  Administered 2018-06-21: 0.5 mL via INTRAMUSCULAR

## 2018-06-21 NOTE — Progress Notes (Signed)
ROB & glucola- doing well except rash is coming back a little bit on arms. Schedule for sibling class given.

## 2018-06-21 NOTE — Patient Instructions (Signed)

## 2018-06-21 NOTE — Progress Notes (Signed)
ROB- glucola done today,blood consent signed, tdap given, pt is doing well 

## 2018-06-22 LAB — CBC
HEMATOCRIT: 31.7 % — AB (ref 34.0–46.6)
Hemoglobin: 10.6 g/dL — ABNORMAL LOW (ref 11.1–15.9)
MCH: 27.8 pg (ref 26.6–33.0)
MCHC: 33.4 g/dL (ref 31.5–35.7)
MCV: 83 fL (ref 79–97)
Platelets: 269 10*3/uL (ref 150–450)
RBC: 3.81 x10E6/uL (ref 3.77–5.28)
RDW: 13.1 % (ref 12.3–15.4)
WBC: 9.6 10*3/uL (ref 3.4–10.8)

## 2018-06-22 LAB — GLUCOSE TOLERANCE, 1 HOUR: Glucose, 1Hr PP: 152 mg/dL (ref 65–199)

## 2018-06-22 LAB — RPR: RPR: NONREACTIVE

## 2018-06-24 ENCOUNTER — Encounter: Payer: Self-pay | Admitting: Certified Nurse Midwife

## 2018-06-24 ENCOUNTER — Other Ambulatory Visit: Payer: Self-pay | Admitting: Certified Nurse Midwife

## 2018-06-24 DIAGNOSIS — R7309 Other abnormal glucose: Secondary | ICD-10-CM | POA: Insufficient documentation

## 2018-06-24 DIAGNOSIS — O99019 Anemia complicating pregnancy, unspecified trimester: Secondary | ICD-10-CM | POA: Insufficient documentation

## 2018-06-24 DIAGNOSIS — Z6791 Unspecified blood type, Rh negative: Secondary | ICD-10-CM

## 2018-06-24 DIAGNOSIS — O26899 Other specified pregnancy related conditions, unspecified trimester: Secondary | ICD-10-CM

## 2018-06-24 DIAGNOSIS — Z3483 Encounter for supervision of other normal pregnancy, third trimester: Secondary | ICD-10-CM

## 2018-07-05 ENCOUNTER — Ambulatory Visit (INDEPENDENT_AMBULATORY_CARE_PROVIDER_SITE_OTHER): Payer: Medicaid Other | Admitting: Certified Nurse Midwife

## 2018-07-05 VITALS — BP 119/71 | HR 109 | Wt 196.6 lb

## 2018-07-05 DIAGNOSIS — Z3483 Encounter for supervision of other normal pregnancy, third trimester: Secondary | ICD-10-CM

## 2018-07-05 LAB — POCT URINALYSIS DIPSTICK OB
BILIRUBIN UA: NEGATIVE
Glucose, UA: NEGATIVE
Ketones, UA: NEGATIVE
LEUKOCYTES UA: NEGATIVE
NITRITE UA: NEGATIVE
PH UA: 5 (ref 5.0–8.0)
Spec Grav, UA: 1.015 (ref 1.010–1.025)
UROBILINOGEN UA: 0.2 U/dL

## 2018-07-05 MED ORDER — PROMETHAZINE HCL 25 MG PO TABS: 25.0000 mg | ORAL_TABLET | Freq: Four times a day (QID) | ORAL | 2 refills | Status: DC | PRN

## 2018-07-05 NOTE — Progress Notes (Signed)
ROB-Reports two (2) episodes of n/v yesterday. Requests phenergan refill, see orders. Discussed glucola results and reports vomiting after screen. Declines GTT. Agrees to keep blood sugar log x 1 week and bring results to next visit. Anticipatory guidance regarding course of prenatal care. Reviewed red flag symptoms and when to call. RTC x 2 weeks for ROB or sooner if needed.

## 2018-07-05 NOTE — Patient Instructions (Signed)
Third Trimester of Pregnancy The third trimester is from week 29 through week 42, months 7 through 9. This trimester is when your unborn baby (fetus) is growing very fast. At the end of the ninth month, the unborn baby is about 20 inches in length. It weighs about 6-10 pounds. Follow these instructions at home:  Avoid all smoking, herbs, and alcohol. Avoid drugs not approved by your doctor.  Do not use any tobacco products, including cigarettes, chewing tobacco, and electronic cigarettes. If you need help quitting, ask your doctor. You may get counseling or other support to help you quit.  Only take medicine as told by your doctor. Some medicines are safe and some are not during pregnancy.  Exercise only as told by your doctor. Stop exercising if you start having cramps.  Eat regular, healthy meals.  Wear a good support bra if your breasts are tender.  Do not use hot tubs, steam rooms, or saunas.  Wear your seat belt when driving.  Avoid raw meat, uncooked cheese, and liter boxes and soil used by cats.  Take your prenatal vitamins.  Take 1500-2000 milligrams of calcium daily starting at the 20th week of pregnancy until you deliver your baby.  Try taking medicine that helps you poop (stool softener) as needed, and if your doctor approves. Eat more fiber by eating fresh fruit, vegetables, and whole grains. Drink enough fluids to keep your pee (urine) clear or pale yellow.  Take warm water baths (sitz baths) to soothe pain or discomfort caused by hemorrhoids. Use hemorrhoid cream if your doctor approves.  If you have puffy, bulging veins (varicose veins), wear support hose. Raise (elevate) your feet for 15 minutes, 3-4 times a day. Limit salt in your diet.  Avoid heavy lifting, wear low heels, and sit up straight.  Rest with your legs raised if you have leg cramps or low back pain.  Visit your dentist if you have not gone during your pregnancy. Use a soft toothbrush to brush your  teeth. Be gentle when you floss.  You can have sex (intercourse) unless your doctor tells you not to.  Do not travel far distances unless you must. Only do so with your doctor's approval.  Take prenatal classes.  Practice driving to the hospital.  Pack your hospital bag.  Prepare the baby's room.  Go to your doctor visits. Get help if:  You are not sure if you are in labor or if your water has broken.  You are dizzy.  You have mild cramps or pressure in your lower belly (abdominal).  You have a nagging pain in your belly area.  You continue to feel sick to your stomach (nauseous), throw up (vomit), or have watery poop (diarrhea).  You have bad smelling fluid coming from your vagina.  You have pain with peeing (urination). Get help right away if:  You have a fever.  You are leaking fluid from your vagina.  You are spotting or bleeding from your vagina.  You have severe belly cramping or pain.  You lose or gain weight rapidly.  You have trouble catching your breath and have chest pain.  You notice sudden or extreme puffiness (swelling) of your face, hands, ankles, feet, or legs.  You have not felt the baby move in over an hour.  You have severe headaches that do not go away with medicine.  You have vision changes. This information is not intended to replace advice given to you by your health care provider. Make   sure you discuss any questions you have with your health care provider. Document Released: 11/29/2009 Document Revised: 02/10/2016 Document Reviewed: 11/05/2012 Elsevier Interactive Patient Education  2017 [employer] WHAT OB PATIENTS CAN EXPECT   Confirmation of pregnancy and ultrasound ordered if medically indicated-[redacted] weeks gestation  New OB (NOB) intake with nurse and New OB (NOB) labs- [redacted] weeks gestation  New OB (NOB) physical examination with provider- 11/[redacted] weeks gestation  Flu vaccine-[redacted] weeks gestation  Anatomy scan-[redacted] weeks  gestation  Glucose tolerance test, blood work to test for anemia, T-dap vaccine-[redacted] weeks gestation  Vaginal swabs/cultures-STD/Group B strep-[redacted] weeks gestation  Appointments every 4 weeks until 28 weeks  Every 2 weeks from 28 weeks until 36 weeks  Weekly visits from 36 weeks until delivery

## 2018-07-05 NOTE — Progress Notes (Signed)
ROB-c/o nausea and vomiting twice yesterday, wondering if she can have prescription for phenergan.

## 2018-07-19 ENCOUNTER — Ambulatory Visit (INDEPENDENT_AMBULATORY_CARE_PROVIDER_SITE_OTHER): Payer: Medicaid Other | Admitting: Obstetrics and Gynecology

## 2018-07-19 VITALS — BP 105/64 | HR 95 | Wt 196.0 lb

## 2018-07-19 DIAGNOSIS — O24419 Gestational diabetes mellitus in pregnancy, unspecified control: Secondary | ICD-10-CM

## 2018-07-19 DIAGNOSIS — O24410 Gestational diabetes mellitus in pregnancy, diet controlled: Secondary | ICD-10-CM

## 2018-07-19 DIAGNOSIS — Z3493 Encounter for supervision of normal pregnancy, unspecified, third trimester: Secondary | ICD-10-CM

## 2018-07-19 LAB — POCT URINALYSIS DIPSTICK OB
Bilirubin, UA: NEGATIVE
Blood, UA: NEGATIVE
GLUCOSE, UA: NEGATIVE
KETONES UA: 5
Leukocytes, UA: NEGATIVE
Nitrite, UA: NEGATIVE
POC,PROTEIN,UA: NEGATIVE
SPEC GRAV UA: 1.01 (ref 1.010–1.025)
Urobilinogen, UA: 0.2 E.U./dL
pH, UA: 6 (ref 5.0–8.0)

## 2018-07-19 NOTE — Progress Notes (Signed)
ROB- pt is doing well 

## 2018-07-19 NOTE — Patient Instructions (Signed)

## 2018-07-19 NOTE — Progress Notes (Signed)
ROB-called nurse line on the 18th due to to episodes of pink spotting without pain. None since. Reviewed week of BS log and all evening PP >145 with a fe elevated lunch PP. fastings normal, patient states she doesn't eat breakfast or much for lung. Referred to lifestyles and discussed diagnosis of GDM. To continue check BS.

## 2018-08-02 ENCOUNTER — Encounter: Payer: Self-pay | Admitting: *Deleted

## 2018-08-02 ENCOUNTER — Encounter: Payer: Medicaid Other | Attending: Obstetrics and Gynecology | Admitting: *Deleted

## 2018-08-02 ENCOUNTER — Ambulatory Visit (INDEPENDENT_AMBULATORY_CARE_PROVIDER_SITE_OTHER): Payer: Medicaid Other | Admitting: Certified Nurse Midwife

## 2018-08-02 VITALS — BP 110/68 | Ht 64.0 in | Wt 199.6 lb

## 2018-08-02 VITALS — BP 107/74 | HR 98 | Wt 199.3 lb

## 2018-08-02 DIAGNOSIS — Z3A00 Weeks of gestation of pregnancy not specified: Secondary | ICD-10-CM | POA: Insufficient documentation

## 2018-08-02 DIAGNOSIS — O24419 Gestational diabetes mellitus in pregnancy, unspecified control: Secondary | ICD-10-CM

## 2018-08-02 DIAGNOSIS — Z3493 Encounter for supervision of normal pregnancy, unspecified, third trimester: Secondary | ICD-10-CM

## 2018-08-02 DIAGNOSIS — Z713 Dietary counseling and surveillance: Secondary | ICD-10-CM | POA: Insufficient documentation

## 2018-08-02 DIAGNOSIS — O24410 Gestational diabetes mellitus in pregnancy, diet controlled: Secondary | ICD-10-CM

## 2018-08-02 LAB — POCT URINALYSIS DIPSTICK OB
BILIRUBIN UA: NEGATIVE
GLUCOSE, UA: NEGATIVE
KETONES UA: NEGATIVE
Nitrite, UA: NEGATIVE
PH UA: 6.5 (ref 5.0–8.0)
RBC UA: NEGATIVE
Spec Grav, UA: 1.015 (ref 1.010–1.025)
UROBILINOGEN UA: 0.2 U/dL

## 2018-08-02 NOTE — Patient Instructions (Signed)
Read booklet on Gestational Diabetes Follow Gestational Meal Planning Guidelines Limit desserts/sweets Avoid fruit juices and sugar sweetened drinks Don't skip meals Complete a 3 Day Food Record and bring to next appointment Check blood sugars 4 x day - before breakfast and 2 hrs after every meal and record  Bring blood sugar log to all appointments Purchase urine ketone strips if blood sugars not controlled and check urine ketones every am:  If + increase bedtime snack to 1 protein and 2 carbohydrate servings Walk 20-30 minutes at least 5 x week if permitted by MD

## 2018-08-02 NOTE — Progress Notes (Signed)
ROB-Doing well. [facility] appointment earlier today with follow up appointment scheduled for next Friday. Advised to send BS log via MyChart next week. Anticipatory guidance regarding course of prenatal care. Reviewed red flag symptoms and when to call. RTC x 2-3 weeks for growth US, 36 week cultures, and ROB or sooner if needed.

## 2018-08-02 NOTE — Progress Notes (Signed)
ROB, c/o intermittent lower pelvic pressure x1 week.

## 2018-08-02 NOTE — Progress Notes (Signed)
Diabetes Self-Management Education  Visit Type: First/Initial  Appt. Start Time: 1340 Appt. End Time: 1520  08/02/2018  Sheila Mendez, identified by name and date of birth, is a 32 y.o. female with a diagnosis of Diabetes: Gestational Diabetes.   ASSESSMENT  Blood pressure 110/68, height 5\' 4"  (1.626 m), weight 199 lb 9.6 oz (90.5 kg), last menstrual period 12/09/2017. Body mass index is 34.26 kg/m.  Diabetes Self-Management Education - 08/02/18 1618      Visit Information   Visit Type  First/Initial      Initial Visit   Diabetes Type  Gestational Diabetes    Are you currently following a meal plan?  No    Are you taking your medications as prescribed?  Yes    Date Diagnosed  last 2 weeks      Health Coping   How would you rate your overall health?  Fair      Psychosocial Assessment   Patient Belief/Attitude about Diabetes  Other (comment)   "upset - didn't have with first pregnancy, upset that may be a possiblity to get diabetes in the future"   Self-care barriers  None    Self-management support  Doctor's office;Family    Patient Concerns  Nutrition/Meal planning;Glycemic Control;Weight Control;Monitoring;Healthy Lifestyle    Special Needs  None    Preferred Learning Style  Visual;Auditory    Learning Readiness  Ready    How often do you need to have someone help you when you read instructions, pamphlets, or other written materials from your doctor or pharmacy?  1 - Never    What is the last grade level you completed in school?  some college      Pre-Education Assessment   Patient understands the diabetes disease and treatment process.  Needs Instruction    Patient understands incorporating nutritional management into lifestyle.  Needs Instruction    Patient undertands incorporating physical activity into lifestyle.  Needs Instruction    Patient understands using medications safely.  Needs Instruction    Patient understands monitoring blood glucose, interpreting  and using results  Needs Review    Patient understands prevention, detection, and treatment of acute complications.  Needs Instruction    Patient understands prevention, detection, and treatment of chronic complications.  Needs Instruction    Patient understands how to develop strategies to address psychosocial issues.  Needs Instruction    Patient understands how to develop strategies to promote health/change behavior.  Needs Instruction      Complications   How often do you check your blood sugar?  3-4 times/day   Pt has a meter at home.    Fasting Blood glucose range (mg/dL)  95-28470-129   Pt reports FBG's 90's mg/dL.   Postprandial Blood glucose range (mg/dL)  132-440;[id number];70-129   She reports pp's lunch can be in the 70's mg/dL but after supper as high as 150's mg/dL.    Have you had a dilated eye exam in the past 12 months?  Yes    Have you had a dental exam in the past 12 months?  No    Are you checking your feet?  No      Dietary Intake   Breakfast  skips    Lunch  chicken nuggets,; Ramen noodles    Snack (afternoon)  grapes    Dinner  pork, chicken, occasional fish and beef; rice, bread, potatoes, peas, beans, corn, occasional pasta; green beans, lettuce, tomatoes, cuccumber, broccoli    Snack (evening)  sweets  Beverage(s)  water, regular soda, sugar sweetened tea, fruit juices      Exercise   Exercise Type  Light (walking / raking leaves)    How many days per week to you exercise?  7    How many minutes per day do you exercise?  10    Total minutes per week of exercise  70      Patient Education   Previous Diabetes Education  No    Disease state   Definition of diabetes, type 1 and 2, and the diagnosis of diabetes;Factors that contribute to the development of diabetes    Nutrition management   Role of diet in the treatment of diabetes and the relationship between the three main macronutrients and blood glucose level;Reviewed blood glucose goals for pre and post meals and how to  evaluate the patients' food intake on their blood glucose level.    Physical activity and exercise   Role of exercise on diabetes management, blood pressure control and cardiac health.    Monitoring  Purpose and frequency of SMBG.;Taught/discussed recording of test results and interpretation of SMBG.;Ketone testing, when, how.    Chronic complications  Relationship between chronic complications and blood glucose control    Psychosocial adjustment  Identified and addressed patients feelings and concerns about diabetes    Preconception care  Pregnancy and GDM  Role of pre-pregnancy blood glucose control on the development of the fetus;Role of family planning for patients with diabetes;Reviewed with patient blood glucose goals with pregnancy      Individualized Goals (developed by patient)   Reducing Risk  Improve blood sugars Prevent diabetes complications Lose weight - after birth Lead a healthier lifestyle Become more fit     Outcomes   Expected Outcomes  Demonstrated interest in learning. Expect positive outcomes       Individualized Plan for Diabetes Self-Management Training:   Learning Objective:  Patient will have a greater understanding of diabetes self-management. Patient education plan is to attend individual and/or group sessions per assessed needs and concerns.   Plan:   Patient Instructions  Read booklet on Gestational Diabetes Follow Gestational Meal Planning Guidelines Limit desserts/sweets Avoid fruit juices and sugar sweetened drinks Don't skip meals Complete a 3 Day Food Record and bring to next appointment Check blood sugars 4 x day - before breakfast and 2 hrs after every meal and record  Bring blood sugar log to all appointments Purchase urine ketone strips if blood sugars not controlled and check urine ketones every am:  If + increase bedtime snack to 1 protein and 2 carbohydrate servings Walk 20-30 minutes at least 5 x week if permitted by MD  Expected  Outcomes:  Demonstrated interest in learning. Expect positive outcomes  Education material provided:  Gestational Booklet Gestational Meal Planning Guidelines Simple Meal Plan Viewed Gestational Diabetes Video 3 Day Food Record Goals for a Healthy Pregnancy  If problems or questions, patient to contact team via:   Sharion Settler, RN, CCM, CDE [phone]  Future DSME appointment:  August 09, 2018 with the dietitian

## 2018-08-02 NOTE — Patient Instructions (Signed)
Round Ligament Pain The round ligament is a cord of muscle and tissue that helps to support the uterus. It can become a source of pain during pregnancy if it becomes stretched or twisted as the baby grows. The pain usually begins in the second trimester of pregnancy, and it can come and go until the baby is delivered. It is not a serious problem, and it does not cause harm to the baby. Round ligament pain is usually a short, sharp, and pinching pain, but it can also be a dull, lingering, and aching pain. The pain is felt in the lower side of the abdomen or in the groin. It usually starts deep in the groin and moves up to the outside of the hip area. Pain can occur with:  A sudden change in position.  Rolling over in bed.  Coughing or sneezing.  Physical activity.  Follow these instructions at home: Watch your condition for any changes. Take these steps to help with your pain:  When the pain starts, relax. Then try: ? Sitting down. ? Flexing your knees up to your abdomen. ? Lying on your side with one pillow under your abdomen and another pillow between your legs. ? Sitting in a warm bath for 15-20 minutes or until the pain goes away.  Take over-the-counter and prescription medicines only as told by your health care provider.  Move slowly when you sit and stand.  Avoid long walks if they cause pain.  Stop or lessen your physical activities if they cause pain.  Contact a health care provider if:  Your pain does not go away with treatment.  You feel pain in your back that you did not have before.  Your medicine is not helping. Get help right away if:  You develop a fever or chills.  You develop uterine contractions.  You develop vaginal bleeding.  You develop nausea or vomiting.  You develop diarrhea.  You have pain when you urinate. This information is not intended to replace advice given to you by your health care provider. Make sure you discuss any questions you have  with your health care provider. Document Released: 06/13/2008 Document Revised: 02/10/2016 Document Reviewed: 11/11/2014 Elsevier Interactive Patient Education  2018 [employer]. Back Pain in Pregnancy Back pain during pregnancy is common. Back pain may be caused by several factors that are related to changes during your pregnancy. Follow these instructions at home: Managing pain, stiffness, and swelling  If directed, apply ice for sudden (acute) back pain. ? Put ice in a plastic bag. ? Place a towel between your skin and the bag. ? Leave the ice on for 20 minutes, 2-3 times per day.  If directed, apply heat to the affected area before you exercise: ? Place a towel between your skin and the heat pack or heating pad. ? Leave the heat on for 20-30 minutes. ? Remove the heat if your skin turns bright red. This is especially important if you are unable to feel pain, heat, or cold. You may have a greater risk of getting burned. Activity  Exercise as told by your health care provider. Exercising is the best way to prevent or manage back pain.  Listen to your body when lifting. If lifting hurts, ask for help or bend your knees. This uses your leg muscles instead of your back muscles.  Squat down when picking up something from the floor. Do not bend over.  Only use bed rest as told by your health care provider. Bed  rest should only be used for the most severe episodes of back pain. Standing, Sitting, and Lying Down  Do not stand in one place for long periods of time.  Use good posture when sitting. Make sure your head rests over your shoulders and is not hanging forward. Use a pillow on your lower back if necessary.  Try sleeping on your side, preferably the left side, with a pillow or two between your legs. If you are sore after a night's rest, your bed may be too soft. A firm mattress may provide more support for your back during pregnancy. General instructions  Do not wear high  heels.  Eat a healthy diet. Try to gain weight within your health care provider's recommendations.  Use a maternity girdle, elastic sling, or back brace as told by your health care provider.  Take over-the-counter and prescription medicines only as told by your health care provider.  Keep all follow-up visits as told by your health care provider. This is important. This includes any visits with any specialists, such as a physical therapist. Contact a health care provider if:  Your back pain interferes with your daily activities.  You have increasing pain in other parts of your body. Get help right away if:  You develop numbness, tingling, weakness, or problems with the use of your arms or legs.  You develop severe back pain that is not controlled with medicine.  You have a sudden change in bowel or bladder control.  You develop shortness of breath, dizziness, or you faint.  You develop nausea, vomiting, or sweating.  You have back pain that is a rhythmic, cramping pain similar to labor pains. Labor pain is usually 1-2 minutes apart, lasts for about 1 minute, and involves a bearing down feeling or pressure in your pelvis.  You have back pain and your water breaks or you have vaginal bleeding.  You have back pain or numbness that travels down your leg.  Your back pain developed after you fell.  You develop pain on one side of your back.  You see blood in your urine.  You develop skin blisters in the area of your back pain. This information is not intended to replace advice given to you by your health care provider. Make sure you discuss any questions you have with your health care provider. Document Released: 12/13/2005 Document Revised: 02/10/2016 Document Reviewed: 05/19/2015 Elsevier Interactive Patient Education  2018 [employer] WHAT OB PATIENTS CAN EXPECT   Confirmation of pregnancy and ultrasound ordered if medically indicated-[redacted] weeks gestation  New OB (NOB)  intake with nurse and New OB (NOB) labs- [redacted] weeks gestation  New OB (NOB) physical examination with provider- 11/[redacted] weeks gestation  Flu vaccine-[redacted] weeks gestation  Anatomy scan-[redacted] weeks gestation  Glucose tolerance test, blood work to test for anemia, T-dap vaccine-[redacted] weeks gestation  Vaginal swabs/cultures-STD/Group B strep-[redacted] weeks gestation  Appointments every 4 weeks until 28 weeks  Every 2 weeks from 28 weeks until 36 weeks  Weekly visits from 36 weeks until delivery

## 2018-08-09 ENCOUNTER — Encounter: Payer: Medicaid Other | Admitting: Dietician

## 2018-08-09 ENCOUNTER — Encounter: Payer: Self-pay | Admitting: Dietician

## 2018-08-09 VITALS — BP 92/56 | Ht 64.0 in | Wt 201.5 lb

## 2018-08-09 DIAGNOSIS — Z713 Dietary counseling and surveillance: Secondary | ICD-10-CM | POA: Diagnosis not present

## 2018-08-09 DIAGNOSIS — O24410 Gestational diabetes mellitus in pregnancy, diet controlled: Secondary | ICD-10-CM

## 2018-08-09 NOTE — Progress Notes (Signed)
   Patient's BG record indicates BGs are within goal ranges, with fasting BGs ranging 83-96, and post-meal BGs ranging 64-119. One reading of 137 was before supper but after drinking regular soda. Patient has been testing as much as 5-6 times a day and reports soreness in fingertips from frequent tests and use of disposable lancing devices. She will begin using gentler lancing device.   Patient's food diary indicates varying amounts of carbohydrate with meals, but generally healthy food choices. She is limiting sodas to a few swallows at a time.    Provided 1700kcal meal plan, and wrote individualized menus based on patient's food preferences.  Instructed patient on food safety, including avoidance of Listeriosis, and limiting mercury from fish.  Discussed importance of maintaining healthy lifestyle habits to reduce risk of Type 2 DM as well as Gestational DM with any future pregnancies.  Advised patient to use any remaining testing supplies to test some BGs after delivery, and to have BG tested ideally annually, as well as prior to attempting future pregnancies.

## 2018-08-09 NOTE — Patient Instructions (Signed)
   Limit any regular fruit juices to 4oz along with a meal that also has a protein food. Try Healthy Balance (very low sugar) juice, or [employer]ana 50/50 orange juice (1/2 the sugar).   Allow for about 3 carb servings or 45grams of carbohydrate with each meal.   Great job including meals and snacks regularly, keep it up!  Continue to stay active during the day.   Plan to test blood sugar fasting daily + after 1 or more meals each day. 2-4 times daily.

## 2018-08-14 ENCOUNTER — Ambulatory Visit (INDEPENDENT_AMBULATORY_CARE_PROVIDER_SITE_OTHER): Payer: Medicaid Other | Admitting: Certified Nurse Midwife

## 2018-08-14 VITALS — BP 113/63 | HR 107

## 2018-08-14 DIAGNOSIS — Z3493 Encounter for supervision of normal pregnancy, unspecified, third trimester: Secondary | ICD-10-CM

## 2018-08-14 NOTE — Progress Notes (Signed)
ROB doing well. Feels good movement.  Discussed growth u/s next week due to GDM. Reviewed BS log reviewed. Fasting 80-96, 2 hr pp 80-119 . One elevated at 137 GBS at next visit.   Sheila BurkeAnnie Leilanie Mendez, CNM

## 2018-08-14 NOTE — Patient Instructions (Signed)

## 2018-08-22 ENCOUNTER — Encounter: Payer: Self-pay | Admitting: *Deleted

## 2018-08-23 ENCOUNTER — Ambulatory Visit (INDEPENDENT_AMBULATORY_CARE_PROVIDER_SITE_OTHER): Payer: Medicaid Other | Admitting: Certified Nurse Midwife

## 2018-08-23 ENCOUNTER — Encounter: Payer: Medicaid Other | Admitting: Certified Nurse Midwife

## 2018-08-23 VITALS — BP 107/79 | HR 98 | Wt 203.2 lb

## 2018-08-23 DIAGNOSIS — O24410 Gestational diabetes mellitus in pregnancy, diet controlled: Secondary | ICD-10-CM

## 2018-08-23 DIAGNOSIS — Z3A36 36 weeks gestation of pregnancy: Secondary | ICD-10-CM

## 2018-08-23 DIAGNOSIS — Z3493 Encounter for supervision of normal pregnancy, unspecified, third trimester: Secondary | ICD-10-CM

## 2018-08-23 DIAGNOSIS — O24419 Gestational diabetes mellitus in pregnancy, unspecified control: Secondary | ICD-10-CM | POA: Insufficient documentation

## 2018-08-23 LAB — POCT URINALYSIS DIPSTICK OB
Bilirubin, UA: NEGATIVE
GLUCOSE, UA: NEGATIVE
Ketones, UA: NEGATIVE
Leukocytes, UA: NEGATIVE
NITRITE UA: NEGATIVE
PROTEIN: NEGATIVE
RBC UA: NEGATIVE
SPEC GRAV UA: 1.02 (ref 1.010–1.025)
Urobilinogen, UA: 0.2 E.U./dL
pH, UA: 7 (ref 5.0–8.0)

## 2018-08-23 NOTE — Patient Instructions (Signed)
Vaginal Delivery Vaginal delivery means that you will give birth by pushing your baby out of your birth canal (vagina). A team of health care providers will help you before, during, and after vaginal delivery. Birth experiences are unique for every woman and every pregnancy, and birth experiences vary depending on where you choose to give birth. What should I do to prepare for my baby's birth? Before your baby is born, it is important to talk with your health care provider about:  Your labor and delivery preferences. These may include: ? Medicines that you may be given. ? How you will manage your pain. This might include non-medical pain relief techniques or injectable pain relief such as epidural analgesia. ? How you and your baby will be monitored during labor and delivery. ? Who may be in the labor and delivery room with you. ? Your feelings about surgical delivery of your baby (cesarean delivery, or C-section) if this becomes necessary. ? Your feelings about receiving donated blood through an IV tube (blood transfusion) if this becomes necessary.  Whether you are able: ? To take pictures or videos of the birth. ? To eat during labor and delivery. ? To move around, walk, or change positions during labor and delivery.  What to expect after your baby is born, such as: ? Whether delayed umbilical cord clamping and cutting is offered. ? Who will care for your baby right after birth. ? Medicines or tests that may be recommended for your baby. ? Whether breastfeeding is supported in your hospital or birth center. ? How long you will be in the hospital or birth center.  How any medical conditions you have may affect your baby or your labor and delivery experience.  To prepare for your baby's birth, you should also:  Attend all of your health care visits before delivery (prenatal visits) as recommended by your health care provider. This is important.  Prepare your home for your baby's  arrival. Make sure that you have: ? Diapers. ? Baby clothing. ? Feeding equipment. ? Safe sleeping arrangements for you and your baby.  Install a car seat in your vehicle. Have your car seat checked by a certified car seat installer to make sure that it is installed safely.  Think about who will help you with your new baby at home for at least the first several weeks after delivery.  What can I expect when I arrive at the birth center or hospital? Once you are in labor and have been admitted into the hospital or birth center, your health care provider may:  Review your pregnancy history and any concerns you have.  Insert an IV tube into one of your veins. This is used to give you fluids and medicines.  Check your blood pressure, pulse, temperature, and heart rate (vital signs).  Check whether your bag of water (amniotic sac) has broken (ruptured).  Talk with you about your birth plan and discuss pain control options.  Monitoring Your health care provider may monitor your contractions (uterine monitoring) and your baby's heart rate (fetal monitoring). You may need to be monitored:  Often, but not continuously (intermittently).  All the time or for long periods at a time (continuously). Continuous monitoring may be needed if: ? You are taking certain medicines, such as medicine to relieve pain or make your contractions stronger. ? You have pregnancy or labor complications.  Monitoring may be done by:  Placing a special stethoscope or a handheld monitoring device on your abdomen to   check your baby's heartbeat, and feeling your abdomen for contractions. This method of monitoring does not continuously record your baby's heartbeat or your contractions.  Placing monitors on your abdomen (external monitors) to record your baby's heartbeat and the frequency and length of contractions. You may not have to wear external monitors all the time.  Placing monitors inside of your uterus  (internal monitors) to record your baby's heartbeat and the frequency, length, and strength of your contractions. ? Your health care provider may use internal monitors if he or she needs more information about the strength of your contractions or your baby's heart rate. ? Internal monitors are put in place by passing a thin, flexible wire through your vagina and into your uterus. Depending on the type of monitor, it may remain in your uterus or on your baby's head until birth. ? Your health care provider will discuss the benefits and risks of internal monitoring with you and will ask for your permission before inserting the monitors.  Telemetry. This is a type of continuous monitoring that can be done with external or internal monitors. Instead of having to stay in bed, you are able to move around during telemetry. Ask your health care provider if telemetry is an option for you.  Physical exam Your health care provider may perform a physical exam. This may include:  Checking whether your baby is positioned: ? With the head toward your vagina (head-down). This is most common. ? With the head toward the top of your uterus (head-up or breech). If your baby is in a breech position, your health care provider may try to turn your baby to a head-down position so you can deliver vaginally. If it does not seem that your baby can be born vaginally, your provider may recommend surgery to deliver your baby. In rare cases, you may be able to deliver vaginally if your baby is head-up (breech delivery). ? Lying sideways (transverse). Babies that are lying sideways cannot be delivered vaginally.  Checking your cervix to determine: ? Whether it is thinning out (effacing). ? Whether it is opening up (dilating). ? How low your baby has moved into your birth canal.  What are the three stages of labor and delivery?  Normal labor and delivery is divided into the following three stages: Stage 1  Stage 1 is the  longest stage of labor, and it can last for hours or days. Stage 1 includes: ? Early labor. This is when contractions may be irregular, or regular and mild. Generally, early labor contractions are more than 10 minutes apart. ? Active labor. This is when contractions get longer, more regular, more frequent, and more intense. ? The transition phase. This is when contractions happen very close together, are very intense, and may last longer than during any other part of labor.  Contractions generally feel mild, infrequent, and irregular at first. They get stronger, more frequent (about every 2-3 minutes), and more regular as you progress from early labor through active labor and transition.  Many women progress through stage 1 naturally, but you may need help to continue making progress. If this happens, your health care provider may talk with you about: ? Rupturing your amniotic sac if it has not ruptured yet. ? Giving you medicine to help make your contractions stronger and more frequent.  Stage 1 ends when your cervix is completely dilated to 4 inches (10 cm) and completely effaced. This happens at the end of the transition phase. Stage 2  Once   your cervix is completely effaced and dilated to 4 inches (10 cm), you may start to feel an urge to push. It is common for the body to naturally take a rest before feeling the urge to push, especially if you received an epidural or certain other pain medicines. This rest period may last for up to 1-2 hours, depending on your unique labor experience.  During stage 2, contractions are generally less painful, because pushing helps relieve contraction pain. Instead of contraction pain, you may feel stretching and burning pain, especially when the widest part of your baby's head passes through the vaginal opening (crowning).  Your health care provider will closely monitor your pushing progress and your baby's progress through the vagina during stage 2.  Your  health care provider may massage the area of skin between your vaginal opening and anus (perineum) or apply warm compresses to your perineum. This helps it stretch as the baby's head starts to crown, which can help prevent perineal tearing. ? In some cases, an incision may be made in your perineum (episiotomy) to allow the baby to pass through the vaginal opening. An episiotomy helps to make the opening of the vagina larger to allow more room for the baby to fit through.  It is very important to breathe and focus so your health care provider can control the delivery of your baby's head. Your health care provider may have you decrease the intensity of your pushing, to help prevent perineal tearing.  After delivery of your baby's head, the shoulders and the rest of the body generally deliver very quickly and without difficulty.  Once your baby is delivered, the umbilical cord may be cut right away, or this may be delayed for 1-2 minutes, depending on your baby's health. This may vary among health care providers, hospitals, and birth centers.  If you and your baby are healthy enough, your baby may be placed on your chest or abdomen to help maintain the baby's temperature and to help you bond with each other. Some mothers and babies start breastfeeding at this time. Your health care team will dry your baby and help keep your baby warm during this time.  Your baby may need immediate care if he or she: ? Showed signs of distress during labor. ? Has a medical condition. ? Was born too early (prematurely). ? Had a bowel movement before birth (meconium). ? Shows signs of difficulty transitioning from being inside the uterus to being outside of the uterus. If you are planning to breastfeed, your health care team will help you begin a feeding. Stage 3  The third stage of labor starts immediately after the birth of your baby and ends after you deliver the placenta. The placenta is an organ that develops  during pregnancy to provide oxygen and nutrients to your baby in the womb.  Delivering the placenta may require some pushing, and you may have mild contractions. Breastfeeding can stimulate contractions to help you deliver the placenta.  After the placenta is delivered, your uterus should tighten (contract) and become firm. This helps to stop bleeding in your uterus. To help your uterus contract and to control bleeding, your health care provider may: ? Give you medicine by injection, through an IV tube, by mouth, or through your rectum (rectally). ? Massage your abdomen or perform a vaginal exam to remove any blood clots that are left in your uterus. ? Empty your bladder by placing a thin, flexible tube (catheter) into your bladder. ? Encourage   you to breastfeed your baby. After labor is over, you and your baby will be monitored closely to ensure that you are both healthy until you are ready to go home. Your health care team will teach you how to care for yourself and your baby. This information is not intended to replace advice given to you by your health care provider. Make sure you discuss any questions you have with your health care provider. Document Released: 06/13/2008 Document Revised: 03/24/2016 Document Reviewed: 09/19/2015 Elsevier Interactive Patient Education  2018 [employer]  

## 2018-08-23 NOTE — Progress Notes (Signed)
ROB-Reports discomfort with fetal movement, back pain, and vaginal pressure. Discussed home treatment measures. 36 week cultures collected. Anticipatory guidance regarding course of prenatal care. Herbal prep handout given. Reviewed red flag symptoms and when to call. RTC x 1 week for GROWTH US and ROB or sooner if needed.

## 2018-08-23 NOTE — Progress Notes (Signed)
ROB, c/o constant sharp vaginal and back pain, sits in warm tub with some relief.

## 2018-08-25 LAB — STREP GP B NAA: Strep Gp B NAA: NEGATIVE

## 2018-08-27 LAB — GC/CHLAMYDIA PROBE AMP
CHLAMYDIA, DNA PROBE: NEGATIVE
NEISSERIA GONORRHOEAE BY PCR: NEGATIVE

## 2018-08-29 ENCOUNTER — Ambulatory Visit (INDEPENDENT_AMBULATORY_CARE_PROVIDER_SITE_OTHER): Payer: Medicaid Other

## 2018-08-29 ENCOUNTER — Ambulatory Visit (INDEPENDENT_AMBULATORY_CARE_PROVIDER_SITE_OTHER): Payer: Medicaid Other | Admitting: Obstetrics and Gynecology

## 2018-08-29 DIAGNOSIS — O24410 Gestational diabetes mellitus in pregnancy, diet controlled: Secondary | ICD-10-CM

## 2018-08-29 DIAGNOSIS — Z3493 Encounter for supervision of normal pregnancy, unspecified, third trimester: Secondary | ICD-10-CM

## 2018-08-29 DIAGNOSIS — Z3A37 37 weeks gestation of pregnancy: Secondary | ICD-10-CM | POA: Diagnosis not present

## 2018-08-29 NOTE — Progress Notes (Signed)
ROB and growth scan: Ultrasound reveals:  Indications:growth/afi Findings:  Singleton intrauterine pregnancy is visualized with FHR at 168 BPM. Biometrics give an (U/S) Gestational age of 6932w3d and an (U/S) EDD of 09/09/18; this correlates with the clinically established Estimated Date of Delivery: 09/15/18.  Fetal presentation is Cephalic.  Placenta: placenta in the Rt lateral extended anteriorly and posteriorly. Grade: 2 AFI: 11.5cm  Growth percentile is 67. EFW: 7lb10oz/3445g (document both grams and pounds)  Impression: 1. 418w4d Viable Singleton Intrauterine pregnancy previously established criteria. 2. Growth is 67 %ile.  AFI is 11.5 cm.     Reports sugars , when checked have been normal, but doesn't check consistently.

## 2018-09-02 ENCOUNTER — Encounter: Payer: Self-pay | Admitting: *Deleted

## 2018-09-05 ENCOUNTER — Encounter: Payer: Medicaid Other | Admitting: Obstetrics and Gynecology

## 2018-09-09 ENCOUNTER — Ambulatory Visit (INDEPENDENT_AMBULATORY_CARE_PROVIDER_SITE_OTHER): Payer: Medicaid Other | Admitting: Certified Nurse Midwife

## 2018-09-09 VITALS — BP 105/75 | HR 98 | Wt 208.1 lb

## 2018-09-09 DIAGNOSIS — Z3493 Encounter for supervision of normal pregnancy, unspecified, third trimester: Secondary | ICD-10-CM | POA: Diagnosis not present

## 2018-09-09 LAB — POCT URINALYSIS DIPSTICK OB
Bilirubin, UA: NEGATIVE
Blood, UA: NEGATIVE
Glucose, UA: NEGATIVE
Ketones, UA: NEGATIVE
NITRITE UA: NEGATIVE
PH UA: 6 (ref 5.0–8.0)
PROTEIN: NEGATIVE
Spec Grav, UA: 1.02 (ref 1.010–1.025)
Urobilinogen, UA: 0.2 E.U./dL

## 2018-09-09 NOTE — Patient Instructions (Addendum)
Vaginal Delivery  Vaginal delivery means that you give birth by pushing your baby out of your birth canal (vagina). A team of health care providers will help you before, during, and after vaginal delivery. Birth experiences are unique for every woman and every pregnancy, and birth experiences vary depending on where you choose to give birth. What happens when I arrive at the birth center or hospital? Once you are in labor and have been admitted into the hospital or birth center, your health care provider may:  Review your pregnancy history and any concerns that you have.  Insert an IV into one of your veins. This may be used to give you fluids and medicines.  Check your blood pressure, pulse, temperature, and heart rate (vital signs).  Check whether your bag of water (amniotic sac) has broken (ruptured).  Talk with you about your birth plan and discuss pain control options. Monitoring Your health care provider may monitor your contractions (uterine monitoring) and your baby's heart rate (fetal monitoring). You may need to be monitored:  Often, but not continuously (intermittently).  All the time or for long periods at a time (continuously). Continuous monitoring may be needed if: ? You are taking certain medicines, such as medicine to relieve pain or make your contractions stronger. ? You have pregnancy or labor complications. Monitoring may be done by:  Placing a special stethoscope or a handheld monitoring device on your abdomen to check your baby's heartbeat and to check for contractions.  Placing monitors on your abdomen (external monitors) to record your baby's heartbeat and the frequency and length of contractions.  Placing monitors inside your uterus through your vagina (internal monitors) to record your baby's heartbeat and the frequency, length, and strength of your contractions. Depending on the type of monitor, it may remain in your uterus or on your baby's head until  birth.  Telemetry. This is a type of continuous monitoring that can be done with external or internal monitors. Instead of having to stay in bed, you are able to move around during telemetry. Physical exam Your health care provider may perform frequent physical exams. This may include:  Checking how and where your baby is positioned in your uterus.  Checking your cervix to determine: ? Whether it is thinning out (effacing). ? Whether it is opening up (dilating). What happens during labor and delivery?  Normal labor and delivery is divided into the following three stages: Stage 1  This is the longest stage of labor.  This stage can last for hours or days.  Throughout this stage, you will feel contractions. Contractions generally feel mild, infrequent, and irregular at first. They get stronger, more frequent (about every 2-3 minutes), and more regular as you move through this stage.  This stage ends when your cervix is completely dilated to 4 inches (10 cm) and completely effaced. Stage 2  This stage starts once your cervix is completely effaced and dilated and lasts until the delivery of your baby.  This stage may last from 20 minutes to 2 hours.  This is the stage where you will feel an urge to push your baby out of your vagina.  You may feel stretching and burning pain, especially when the widest part of your baby's head passes through the vaginal opening (crowning).  Once your baby is delivered, the umbilical cord will be clamped and cut. This usually occurs after waiting a period of 1-2 minutes after delivery.  Your baby will be placed on your bare chest (  skin-to-skin contact) in an upright position and covered with a warm blanket. Watch your baby for feeding cues, like rooting or sucking, and help the baby to your breast for his or her first feeding. Stage 3  This stage starts immediately after the birth of your baby and ends after you deliver the placenta.  This stage may  take anywhere from 5 to 30 minutes.  After your baby has been delivered, you will feel contractions as your body expels the placenta and your uterus contracts to control bleeding. What can I expect after labor and delivery?  After labor is over, you and your baby will be monitored closely until you are ready to go home to ensure that you are both healthy. Your health care team will teach you how to care for yourself and your baby.  You and your baby will stay in the same room (rooming in) during your hospital stay. This will encourage early bonding and successful breastfeeding.  You may continue to receive fluids and medicines through an IV.  Your uterus will be checked and massaged regularly (fundal massage).  You will have some soreness and pain in your abdomen, vagina, and the area of skin between your vaginal opening and your anus (perineum).  If an incision was made near your vagina (episiotomy) or if you had some vaginal tearing during delivery, cold compresses may be placed on your episiotomy or your tear. This helps to reduce pain and swelling.  You may be given a squirt bottle to use instead of wiping when you go to the bathroom. To use the squirt bottle, follow these steps: ? Before you urinate, fill the squirt bottle with warm water. Do not use hot water. ? After you urinate, while you are sitting on the toilet, use the squirt bottle to rinse the area around your urethra and vaginal opening. This rinses away any urine and blood. ? Fill the squirt bottle with clean water every time you use the bathroom.  It is normal to have vaginal bleeding after delivery. Wear a sanitary pad for vaginal bleeding and discharge. Summary  Vaginal delivery means that you will give birth by pushing your baby out of your birth canal (vagina).  Your health care provider may monitor your contractions (uterine monitoring) and your baby's heart rate (fetal monitoring).  Your health care provider may  perform a physical exam.  Normal labor and delivery is divided into three stages.  After labor is over, you and your baby will be monitored closely until you are ready to go home. This information is not intended to replace advice given to you by your health care provider. Make sure you discuss any questions you have with your health care provider. Document Released: 06/13/2008 Document Revised: 10/09/2017 Document Reviewed: 10/09/2017 Elsevier Interactive Patient Education  2019 [employer]   Fetal Movement Counts Patient Name: ________________________________________________ Patient Due Date: ____________________ What is a fetal movement count?  A fetal movement count is the number of times that you feel your baby move during a certain amount of time. This may also be called a fetal kick count. A fetal movement count is recommended for every pregnant woman. You may be asked to start counting fetal movements as early as week 28 of your pregnancy. Pay attention to when your baby is most active. You may notice your baby's sleep and wake cycles. You may also notice things that make your baby move more. You should do a fetal movement count:  When your baby  is normally most active.  At the same time each day. A good time to count movements is while you are resting, after having something to eat and drink. How do I count fetal movements? 1. Find a quiet, comfortable area. Sit, or lie down on your side. 2. Write down the date, the start time and stop time, and the number of movements that you felt between those two times. Take this information with you to your health care visits. 3. For 2 hours, count kicks, flutters, swishes, rolls, and jabs. You should feel at least 10 movements during 2 hours. 4. You may stop counting after you have felt 10 movements. 5. If you do not feel 10 movements in 2 hours, have something to eat and drink. Then, keep resting and counting for 1 hour. If you feel at  least 4 movements during that hour, you may stop counting. Contact a health care provider if:  You feel fewer than 4 movements in 2 hours.  Your baby is not moving like he or she usually does. Date: ____________ Start time: ____________ Stop time: ____________ Movements: ____________ Date: ____________ Start time: ____________ Stop time: ____________ Movements: ____________ Date: ____________ Start time: ____________ Stop time: ____________ Movements: ____________ Date: ____________ Start time: ____________ Stop time: ____________ Movements: ____________ Date: ____________ Start time: ____________ Stop time: ____________ Movements: ____________ Date: ____________ Start time: ____________ Stop time: ____________ Movements: ____________ Date: ____________ Start time: ____________ Stop time: ____________ Movements: ____________ Date: ____________ Start time: ____________ Stop time: ____________ Movements: ____________ Date: ____________ Start time: ____________ Stop time: ____________ Movements: ____________ This information is not intended to replace advice given to you by your health care provider. Make sure you discuss any questions you have with your health care provider. Document Released: 10/04/2006 Document Revised: 05/03/2016 Document Reviewed: 10/14/2015 Elsevier Interactive Patient Education  2019 [employer].   Contraception Choices Contraception, also called birth control, means things to use or ways to try not to get pregnant. Hormonal birth control This kind of birth control uses hormones. Here are some types of hormonal birth control:  A tube that is put under skin of the arm (implant). The tube can stay in for as long as 3 years.  Shots to get every 3 months (injections).  Pills to take every day (birth control pills).  A patch to change 1 time each week for 3 weeks (birth control patch). After that, the patch is taken off for 1 week.  A ring to put in the vagina. The  ring is left in for 3 weeks. Then it is taken out of the vagina for 1 week. Then a new ring is put in.  Pills to take after unprotected sex (emergency birth control pills). Barrier birth control Here are some types of barrier birth control:  A thin covering that is put on the penis before sex (female condom). The covering is thrown away after sex.  A soft, loose covering that is put in the vagina before sex (female condom). The covering is thrown away after sex.  A rubber bowl that sits over the cervix (diaphragm). The bowl must be made for you. The bowl is put into the vagina before sex. The bowl is left in for 6-8 hours after sex. It is taken out within 24 hours.  A small, soft cup that fits over the cervix (cervical cap). The cup must be made for you. The cup can be left in for 6-8 hours after sex. It is taken out within 48  hours.  A sponge that is put into the vagina before sex. It must be left in for at least 6 hours after sex. It must be taken out within 30 hours. Then it is thrown away.  A chemical that kills or stops sperm from getting into the uterus (spermicide). It may be a pill, cream, jelly, or foam to put in the vagina. The chemical should be used at least 10-15 minutes before sex. IUD (intrauterine) birth control An IUD is a small, T-shaped piece of plastic. It is put inside the uterus. There are two kinds:  Hormone IUD. This kind can stay in for 3-5 years.  Copper IUD. This kind can stay in for 10 years. Permanent birth control Here are some types of permanent birth control:  Surgery to block the fallopian tubes.  Having an insert put into each fallopian tube.  Surgery to tie off the tubes that carry sperm (vasectomy). Natural planning birth control Here are some types of natural planning birth control:  Not having sex on the days the woman could get pregnant.  Using a calendar: ? To keep track of the length of each period. ? To find out what days pregnancy can  happen. ? To plan to not have sex on days when pregnancy can happen.  Watching for symptoms of ovulation and not having sex during ovulation. One way the woman can check for ovulation is to check her temperature.  Waiting to have sex until after ovulation. Summary  Contraception, also called birth control, means things to use or ways to try not to get pregnant.  Hormonal methods of birth control include implants, injections, pills, patches, vaginal rings, and emergency birth control pills.  Barrier methods of birth control can include female condoms, female condoms, diaphragms, cervical caps, sponges, and spermicides.  There are two types of IUD (intrauterine device) birth control. An IUD can be put in a woman's uterus to prevent pregnancy for 3-5 years.  Permanent sterilization can be done through a procedure for males, females, or both.  Natural planning methods involve not having sex on the days when the woman could get pregnant. This information is not intended to replace advice given to you by your health care provider. Make sure you discuss any questions you have with your health care provider. Document Released: 07/02/2009 Document Revised: 04/11/2018 Document Reviewed: 09/14/2016 Elsevier Interactive Patient Education  2019 [employer].  Labor Induction  Labor induction is when steps are taken to cause a pregnant woman to begin the labor process. Most women go into labor on their own between 37 weeks and 42 weeks of pregnancy. When this does not happen or when there is a medical need for labor to begin, steps may be taken to induce labor. Labor induction causes a pregnant woman's uterus to contract. It also causes the cervix to soften (ripen), open (dilate), and thin out (efface). Usually, labor is not induced before 39 weeks of pregnancy unless there is a medical reason to do so. Your health care provider will determine if labor induction is needed. Before inducing labor, your  health care provider will consider a number of factors, including:  Your medical condition and your baby's.  How many weeks along you are in your pregnancy.  How mature your baby's lungs are.  The condition of your cervix.  The position of your baby.  The size of your birth canal. What are some reasons for labor induction? Labor may be induced if:  Your health or your  baby's health is at risk.  Your pregnancy is overdue by 1 week or more.  Your water breaks but labor does not start on its own.  There is a low amount of amniotic fluid around your baby. You may also choose (elect) to have labor induced at a certain time. Generally, elective labor induction is done no earlier than 39 weeks of pregnancy. What methods are used for labor induction? Methods used for labor induction include:  Prostaglandin medicine. This medicine starts contractions and causes the cervix to dilate and ripen. It can be taken by mouth (orally) or by being inserted into the vagina (suppository).  Inserting a small, thin tube (catheter) with a balloon into the vagina and then expanding the balloon with water to dilate the cervix.  Stripping the membranes. In this method, your health care provider gently separates amniotic sac tissue from the cervix. This causes the cervix to stretch, which in turn causes the release of a hormone called progesterone. The hormone causes the uterus to contract. This procedure is often done during an office visit, after which you will be sent home to wait for contractions to begin.  Breaking the water. In this method, your health care provider uses a small instrument to make a small hole in the amniotic sac. This eventually causes the amniotic sac to break. Contractions should begin after a few hours.  Medicine to trigger or strengthen contractions. This medicine is given through an IV that is inserted into a vein in your arm. Except for membrane stripping, which can be done in a  clinic, labor induction is done in the hospital so that you and your baby can be carefully monitored. How long does it take for labor to be induced? The length of time it takes to induce labor depends on how ready your body is for labor. Some inductions can take up to 2-3 days, while others may take less than a day. Induction may take longer if:  You are induced early in your pregnancy.  It is your first pregnancy.  Your cervix is not ready. What are some risks associated with labor induction? Some risks associated with labor induction include:  Changes in fetal heart rate, such as being too high, too low, or irregular (erratic).  Failed induction.  Infection in the mother or the baby.  Increased risk of having a cesarean delivery.  Fetal death.  Breaking off (abruption) of the placenta from the uterus (rare).  Rupture of the uterus (very rare). When induction is needed for medical reasons, the benefits of induction generally outweigh the risks. What are some reasons for not inducing labor? Labor induction should not be done if:  Your baby does not tolerate contractions.  You have had previous surgeries on your uterus, such as a myomectomy, removal of fibroids, or a vertical scar from a previous cesarean delivery.  Your placenta lies very low in your uterus and blocks the opening of the cervix (placenta previa).  Your baby is not in a head-down position.  The umbilical cord drops down into the birth canal in front of the baby.  There are unusual circumstances, such as the baby being very early (premature).  You have had more than 2 previous cesarean deliveries. Summary  Labor induction is when steps are taken to cause a pregnant woman to begin the labor process.  Labor induction causes a pregnant woman's uterus to contract. It also causes the cervix to ripen, dilate, and efface.  Labor is not induced before  39 weeks of pregnancy unless there is a medical reason to do  so.  When induction is needed for medical reasons, the benefits of induction generally outweigh the risks. This information is not intended to replace advice given to you by your health care provider. Make sure you discuss any questions you have with your health care provider. Document Released: 01/24/2007 Document Revised: 10/18/2016 Document Reviewed: 10/18/2016 Elsevier Interactive Patient Education  2019 [employer].

## 2018-09-09 NOTE — Progress Notes (Signed)
ROB, c/o "feeling heavy" in lower extremities.

## 2018-09-09 NOTE — Progress Notes (Signed)
ROB-Reports pelvic pressure and pain. Discussed home treatment measures. Education regarding postpartum contraception options, see AVS. Report [facility] for [facility] as previously scheduled or sooner if needed. RTC x 6 weeks for PPV with MNS.

## 2018-09-12 ENCOUNTER — Inpatient Hospital Stay
Admission: EM | Admit: 2018-09-12 | Discharge: 2018-09-15 | DRG: 768 | Disposition: A | Discharge status: Home / Self Care | Payer: Medicaid Other | Attending: Certified Nurse Midwife | Admitting: Certified Nurse Midwife

## 2018-09-12 ENCOUNTER — Other Ambulatory Visit: Payer: Self-pay

## 2018-09-12 DIAGNOSIS — O9902 Anemia complicating childbirth: Secondary | ICD-10-CM | POA: Diagnosis present

## 2018-09-12 DIAGNOSIS — Z6791 Unspecified blood type, Rh negative: Secondary | ICD-10-CM

## 2018-09-12 DIAGNOSIS — O26893 Other specified pregnancy related conditions, third trimester: Secondary | ICD-10-CM | POA: Diagnosis present

## 2018-09-12 DIAGNOSIS — D649 Anemia, unspecified: Secondary | ICD-10-CM | POA: Diagnosis present

## 2018-09-12 DIAGNOSIS — O24420 Gestational diabetes mellitus in childbirth, diet controlled: Secondary | ICD-10-CM | POA: Diagnosis present

## 2018-09-12 DIAGNOSIS — O322XX0 Maternal care for transverse and oblique lie, not applicable or unspecified: Secondary | ICD-10-CM | POA: Diagnosis present

## 2018-09-12 DIAGNOSIS — Z3A39 39 weeks gestation of pregnancy: Secondary | ICD-10-CM

## 2018-09-12 DIAGNOSIS — Z3483 Encounter for supervision of other normal pregnancy, third trimester: Secondary | ICD-10-CM | POA: Diagnosis present

## 2018-09-12 DIAGNOSIS — O894 Spinal and epidural anesthesia-induced headache during the puerperium: Secondary | ICD-10-CM | POA: Diagnosis not present

## 2018-09-12 DIAGNOSIS — R7309 Other abnormal glucose: Secondary | ICD-10-CM

## 2018-09-12 DIAGNOSIS — O26899 Other specified pregnancy related conditions, unspecified trimester: Secondary | ICD-10-CM

## 2018-09-12 HISTORY — DX: Cardiac murmur, unspecified: R01.1

## 2018-09-12 LAB — RAPID HIV SCREEN (HIV 1/2 AB+AG)
HIV 1/2 Antibodies: NONREACTIVE
HIV-1 P24 Antigen - HIV24: NONREACTIVE

## 2018-09-12 LAB — CBC
HCT: 29.5 % — ABNORMAL LOW (ref 36.0–46.0)
Hemoglobin: 9.5 g/dL — ABNORMAL LOW (ref 12.0–15.0)
MCH: 25.3 pg — ABNORMAL LOW (ref 26.0–34.0)
MCHC: 32.2 g/dL (ref 30.0–36.0)
MCV: 78.7 fL — AB (ref 80.0–100.0)
PLATELETS: 257 10*3/uL (ref 150–400)
RBC: 3.75 MIL/uL — AB (ref 3.87–5.11)
RDW: 14.5 % (ref 11.5–15.5)
WBC: 10.3 10*3/uL (ref 4.0–10.5)
nRBC: 0 % (ref 0.0–0.2)

## 2018-09-12 LAB — GLUCOSE, CAPILLARY
GLUCOSE-CAPILLARY: 78 mg/dL (ref 70–99)
Glucose-Capillary: 90 mg/dL (ref 70–99)
Glucose-Capillary: 127 mg/dL — ABNORMAL HIGH (ref 70–99)
Glucose-Capillary: 66 mg/dL — ABNORMAL LOW (ref 70–99)

## 2018-09-12 MED ORDER — FENTANYL 2.5 MCG/ML W/ROPIVACAINE 0.15% IN NS 100 ML EPIDURAL (ARMC)
EPIDURAL | Status: AC
  Filled 2018-09-12: qty 100

## 2018-09-12 MED ORDER — ACETAMINOPHEN 325 MG PO TABS: 650.0000 mg | ORAL_TABLET | ORAL | Status: DC | PRN

## 2018-09-12 MED ORDER — ONDANSETRON HCL 4 MG/2ML IJ SOLN
4.0000 mg | Freq: Four times a day (QID) | INTRAMUSCULAR | Status: DC | PRN
  Administered 2018-09-13: 4 mg via INTRAVENOUS
  Filled 2018-09-12: qty 2

## 2018-09-12 MED ORDER — OXYTOCIN BOLUS FROM INFUSION
500.0000 mL | Freq: Once | INTRAVENOUS | Status: AC
  Administered 2018-09-13: 500 mL via INTRAVENOUS

## 2018-09-12 MED ORDER — OXYTOCIN 40 UNITS IN LACTATED RINGERS INFUSION - SIMPLE MED
1.0000 m[IU]/min | INTRAVENOUS | Status: DC
  Administered 2018-09-12: 1 m[IU]/min via INTRAVENOUS
  Filled 2018-09-12: qty 1000

## 2018-09-12 MED ORDER — TERBUTALINE SULFATE 1 MG/ML IJ SOLN
0.2500 mg | Freq: Once | INTRAMUSCULAR | Status: AC | PRN
  Administered 2018-09-13: 0.25 mg via SUBCUTANEOUS
  Filled 2018-09-12: qty 1

## 2018-09-12 MED ORDER — LACTATED RINGERS IV SOLN: 500.0000 mL | INTRAVENOUS | Status: DC | PRN

## 2018-09-12 MED ORDER — LIDOCAINE HCL (PF) 1 % IJ SOLN: 30.0000 mL | INTRAMUSCULAR | Status: DC | PRN

## 2018-09-12 MED ORDER — SOD CITRATE-CITRIC ACID 500-334 MG/5ML PO SOLN: 30.0000 mL | ORAL | Status: DC | PRN

## 2018-09-12 MED ORDER — OXYTOCIN 40 UNITS IN LACTATED RINGERS INFUSION - SIMPLE MED
2.5000 [IU]/h | INTRAVENOUS | Status: DC
  Administered 2018-09-13: 2.5 [IU]/h via INTRAVENOUS

## 2018-09-12 MED ORDER — MISOPROSTOL 25 MCG QUARTER TABLET
25.0000 ug | ORAL_TABLET | ORAL | Status: DC | PRN
  Administered 2018-09-12 (×2): 25 ug via VAGINAL
  Filled 2018-09-12 (×3): qty 1

## 2018-09-12 MED ORDER — FENTANYL CITRATE (PF) 100 MCG/2ML IJ SOLN
50.0000 ug | INTRAMUSCULAR | Status: DC | PRN
  Administered 2018-09-12: 50 ug via INTRAVENOUS
  Filled 2018-09-12: qty 2

## 2018-09-12 MED ORDER — LACTATED RINGERS IV SOLN
INTRAVENOUS | Status: DC
  Administered 2018-09-12 – 2018-09-14 (×5): via INTRAVENOUS
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
## 2018-09-12 NOTE — ED Notes (Signed)
First Nurse Note: Patient states she is here for induction, patient of Melanie Shamblin at [facility].  Denies contractions.  Alert and oriented, NAD.  To 3rd floor with ED Tech via WC.

## 2018-09-12 NOTE — Progress Notes (Signed)
Sheila Mendez is a 32 y.o. G2P1001 at 2945w4d by LMP admitted for induction of labor due to Gestational diabetes.  Subjective: Reports mild lower pelvic cramping  Objective: BP 112/74 (BP Location: Left Arm)   Pulse 97   Temp 97.8 F (36.6 C) (Oral)   Resp 16   Ht 5\' 3"  (1.6 m)   Wt 94.4 kg   LMP 12/09/2017   BMI 36.86 kg/m  No intake/output data recorded. No intake/output data recorded.  FHT:  FHR: 144 bpm, variability: moderate,  accelerations:  Present,  decelerations:  Absent UC:   irregular, every 2-4 minutes, mild to palpation SVE:   Dilation: 2 Effacement (%): 50 Station: -2 Exam by:: [employer]  Labs: Lab Results  Component Value Date   WBC 10.3 09/12/2018   HGB 9.5 (L) 09/12/2018   HCT 29.5 (L) 09/12/2018   MCV 78.7 (L) 09/12/2018   PLT 257 09/12/2018    Assessment / Plan: IOL responded well to cytotec, 2nd dose placed by me  Labor: Progressing normally Preeclampsia:  labs stable Fetal Wellbeing:  Category I Pain Control:  Labor support without medications I/D:  n/a Anticipated MOD:  NSVD  Sheila Mendez 09/12/2018, 3:18 PM

## 2018-09-13 ENCOUNTER — Inpatient Hospital Stay: Payer: Medicaid Other | Admitting: Anesthesiology

## 2018-09-13 ENCOUNTER — Encounter: Payer: Self-pay | Admitting: Anesthesiology

## 2018-09-13 DIAGNOSIS — O24420 Gestational diabetes mellitus in childbirth, diet controlled: Principal | ICD-10-CM

## 2018-09-13 DIAGNOSIS — Z3A39 39 weeks gestation of pregnancy: Secondary | ICD-10-CM

## 2018-09-13 LAB — GLUCOSE, CAPILLARY
Glucose-Capillary: 100 mg/dL — ABNORMAL HIGH (ref 70–99)
Glucose-Capillary: 90 mg/dL (ref 70–99)

## 2018-09-13 LAB — RPR: RPR Ser Ql: NONREACTIVE

## 2018-09-13 LAB — ABO/RH: ABO/RH(D): A NEG

## 2018-09-13 MED ORDER — SODIUM CHLORIDE 0.9% FLUSH: 3.0000 mL | Freq: Two times a day (BID) | INTRAVENOUS | Status: DC

## 2018-09-13 MED ORDER — ONDANSETRON HCL 4 MG PO TABS: 4.0000 mg | ORAL_TABLET | ORAL | Status: DC | PRN

## 2018-09-13 MED ORDER — LIDOCAINE HCL (PF) 1 % IJ SOLN
INTRAMUSCULAR | Status: AC
  Filled 2018-09-13: qty 30

## 2018-09-13 MED ORDER — OXYTOCIN 10 UNIT/ML IJ SOLN
INTRAMUSCULAR | Status: AC
  Filled 2018-09-13: qty 2

## 2018-09-13 MED ORDER — DIPHENHYDRAMINE HCL 25 MG PO CAPS: 25.0000 mg | ORAL_CAPSULE | Freq: Four times a day (QID) | ORAL | Status: DC | PRN

## 2018-09-13 MED ORDER — MISOPROSTOL 200 MCG PO TABS
ORAL_TABLET | ORAL | Status: AC
  Filled 2018-09-13: qty 4

## 2018-09-13 MED ORDER — DOCUSATE SODIUM 100 MG PO CAPS
100.0000 mg | ORAL_CAPSULE | Freq: Two times a day (BID) | ORAL | Status: DC
  Administered 2018-09-14 – 2018-09-15 (×4): 100 mg via ORAL
  Filled 2018-09-13 (×4): qty 1

## 2018-09-13 MED ORDER — PHENYLEPHRINE 40 MCG/ML (10ML) SYRINGE FOR IV PUSH (FOR BLOOD PRESSURE SUPPORT)
80.0000 ug | PREFILLED_SYRINGE | INTRAVENOUS | Status: AC | PRN
  Administered 2018-09-13 (×3): 200 ug via INTRAVENOUS

## 2018-09-13 MED ORDER — DIBUCAINE 1 % RE OINT
1.0000 "application " | TOPICAL_OINTMENT | RECTAL | Status: DC | PRN
  Administered 2018-09-13: 1 via RECTAL
  Filled 2018-09-13: qty 28

## 2018-09-13 MED ORDER — BENZOCAINE-MENTHOL 20-0.5 % EX AERO
1.0000 "application " | INHALATION_SPRAY | CUTANEOUS | Status: DC | PRN
  Administered 2018-09-13: 1 via TOPICAL

## 2018-09-13 MED ORDER — ACETAMINOPHEN 325 MG PO TABS
650.0000 mg | ORAL_TABLET | ORAL | Status: DC | PRN
  Administered 2018-09-13 – 2018-09-15 (×7): 650 mg via ORAL
  Filled 2018-09-13 (×7): qty 2

## 2018-09-13 MED ORDER — PRENATAL MULTIVITAMIN CH
1.0000 | ORAL_TABLET | Freq: Every day | ORAL | Status: DC
  Administered 2018-09-14 – 2018-09-15 (×2): 1 via ORAL
  Filled 2018-09-13 (×2): qty 1

## 2018-09-13 MED ORDER — IBUPROFEN 600 MG PO TABS
ORAL_TABLET | ORAL | Status: AC
  Administered 2018-09-13: 600 mg via ORAL
  Filled 2018-09-13: qty 1

## 2018-09-13 MED ORDER — BENZOCAINE-MENTHOL 20-0.5 % EX AERO
INHALATION_SPRAY | CUTANEOUS | Status: AC
  Administered 2018-09-13: 1 via TOPICAL
  Filled 2018-09-13: qty 56

## 2018-09-13 MED ORDER — LIDOCAINE HCL (PF) 1 % IJ SOLN: INTRAMUSCULAR | Status: DC | PRN

## 2018-09-13 MED ORDER — WITCH HAZEL-GLYCERIN EX PADS
1.0000 "application " | MEDICATED_PAD | CUTANEOUS | Status: DC | PRN
  Administered 2018-09-13: 1 via TOPICAL
  Filled 2018-09-13: qty 100

## 2018-09-13 MED ORDER — EPHEDRINE 5 MG/ML INJ
10.0000 mg | INTRAVENOUS | Status: DC | PRN
  Administered 2018-09-13: 20 mg via INTRAVENOUS

## 2018-09-13 MED ORDER — BUPIVACAINE HCL (PF) 0.25 % IJ SOLN
INTRAMUSCULAR | Status: DC | PRN
  Administered 2018-09-13: 8 mL via EPIDURAL

## 2018-09-13 MED ORDER — DIPHENHYDRAMINE HCL 50 MG/ML IJ SOLN: 12.5000 mg | INTRAMUSCULAR | Status: DC | PRN

## 2018-09-13 MED ORDER — FENTANYL 2.5 MCG/ML W/ROPIVACAINE 0.15% IN NS 100 ML EPIDURAL (ARMC)
4.0000 mL/h | EPIDURAL | Status: DC
  Administered 2018-09-13: 4 mL/h via EPIDURAL

## 2018-09-13 MED ORDER — LACTATED RINGERS IV SOLN
500.0000 mL | Freq: Once | INTRAVENOUS | Status: AC
  Administered 2018-09-12: 500 mL via INTRAVENOUS

## 2018-09-13 MED ORDER — SODIUM CHLORIDE 0.9% FLUSH: 3.0000 mL | INTRAVENOUS | Status: DC | PRN

## 2018-09-13 MED ORDER — SODIUM CHLORIDE 0.9 % IV SOLN: 250.0000 mL | INTRAVENOUS | Status: DC | PRN

## 2018-09-13 MED ORDER — ONDANSETRON HCL 4 MG/2ML IJ SOLN
4.0000 mg | INTRAMUSCULAR | Status: DC | PRN
  Administered 2018-09-13: 4 mg via INTRAVENOUS

## 2018-09-13 MED ORDER — COCONUT OIL OIL: 1.0000 "application " | TOPICAL_OIL | Status: DC | PRN

## 2018-09-13 MED ORDER — AMMONIA AROMATIC IN INHA
RESPIRATORY_TRACT | Status: AC
  Filled 2018-09-13: qty 10

## 2018-09-13 MED ORDER — IBUPROFEN 600 MG PO TABS
600.0000 mg | ORAL_TABLET | Freq: Four times a day (QID) | ORAL | Status: DC
  Administered 2018-09-13 – 2018-09-15 (×8): 600 mg via ORAL
  Filled 2018-09-13 (×8): qty 1

## 2018-09-13 MED ORDER — PHENYLEPHRINE 40 MCG/ML (10ML) SYRINGE FOR IV PUSH (FOR BLOOD PRESSURE SUPPORT)
80.0000 ug | PREFILLED_SYRINGE | INTRAVENOUS | Status: DC | PRN
  Administered 2018-09-13 (×2): 100 ug via INTRAVENOUS

## 2018-09-13 MED ORDER — LIDOCAINE HCL (PF) 1 % IJ SOLN
INTRAMUSCULAR | Status: DC | PRN
  Administered 2018-09-13: 3 mL

## 2018-09-13 MED ORDER — LIDOCAINE-EPINEPHRINE (PF) 1.5 %-1:200000 IJ SOLN
INTRAMUSCULAR | Status: DC | PRN
  Administered 2018-09-13: 3 mL via PERINEURAL

## 2018-09-13 MED ORDER — EPHEDRINE 5 MG/ML INJ: 10.0000 mg | INTRAVENOUS | Status: DC | PRN

## 2018-09-13 MED ORDER — SENNOSIDES-DOCUSATE SODIUM 8.6-50 MG PO TABS
2.0000 | ORAL_TABLET | ORAL | Status: DC
  Administered 2018-09-14: 2 via ORAL
  Filled 2018-09-13 (×2): qty 2

## 2018-09-13 MED ORDER — SIMETHICONE 80 MG PO CHEW: 80.0000 mg | CHEWABLE_TABLET | ORAL | Status: DC | PRN

## 2018-09-13 NOTE — Progress Notes (Signed)
Sheila Mendez is a 32 y.o. G2P1001 at 8732w5d by LMP admitted for induction of labor due to Gestational diabetes.  Subjective: Reports mild pressure with contractions since epidural placed  Objective: BP 136/71   Pulse 100   Temp 98.3 F (36.8 C) (Oral)   Resp (!) 22   Ht 5\' 3"  (1.6 m)   Wt 94.4 kg   LMP 12/09/2017   SpO2 100%   BMI 36.86 kg/m  I/O last 3 completed shifts: In: 1152.4 [I.V.:1152.4] Out: -  No intake/output data recorded.  FHT:  FHR: 138 bpm, variability: moderate,  accelerations:  Present,  decelerations:  Present intermittent variable decels noted agter epidural placed, resolved now. UC:   irregular, every 6 minutes,mild to palpation with no pitocin SVE:   Dilation: 4 Effacement (%): 50 Station: -2 Exam by:: [employer]  Labs: Lab Results  Component Value Date   WBC 10.3 09/12/2018   HGB 9.5 (L) 09/12/2018   HCT 29.5 (L) 09/12/2018   MCV 78.7 (L) 09/12/2018   PLT 257 09/12/2018    Assessment / Plan: Induction of labor due to gestational diabetes,  progressing well on pitocin  Labor: Progressing on Pitocin, will continue to increase then AROM-AROM with copious amount clear fluid, IUPC & FSE placed without difficulty for more effective fetal monitoring. Preeclampsia:  labs stable Fetal Wellbeing:  Category I Pain Control:  Epidural I/D:  n/a Anticipated MOD:  NSVD  Melody N Shambley 09/13/2018, 3:50 AM

## 2018-09-13 NOTE — Anesthesia Procedure Notes (Signed)
Epidural Patient location during procedure: OB Start time: 09/13/2018 12:19 AM End time: 09/13/2018 12:33 AM  Staffing Anesthesiologist: Yves Dillarroll, Sherrill Mck[place], MD Performed: anesthesiologist   Preanesthetic Checklist Completed: patient identified, site marked, surgical consent, pre-op evaluation, timeout performed, IV checked, risks and benefits discussed and monitors and equipment checked  Epidural Patient position: sitting Prep: Betadine Patient monitoring: heart rate, continuous pulse ox and blood pressure Approach: midline Location: L3-L4 Injection technique: LOR air  Needle:  Needle type: Tuohy  Needle gauge: 17 G Needle length: 9 cm and 9 Catheter type: closed end flexible Catheter size: 19 Gauge Test dose: negative and 1.5% lidocaine with Epi 1:200 K  Assessment Events: blood not aspirated, injection not painful, no injection resistance, negative IV test and no paresthesia  Additional Notes Time out called.  Patient placed in sitting position.  The back was prepped and draped in sterile fashion.  A Skin was made in the L3-L4 interspace with 1% Lidocaine plain.  A 17G Tuohy needle was advanced into the epidural space by a loss of resistance technique.  A minute quantity of fluid noted in the hub thought to be from local.  The epidural catheter was threaded 3 cm into the epidural space and the TD was negative after a negative aspiration for fluid.   Later a bolus dose was given in 2   4 cc increments.  About 10 minutes later, the patient was getting a dense block even though no fluid could be aspirated from the catheter .  It was felt that maybe the patient had a rent or subdural catheter placement, so the catheter was pulled back 1 cm and it was decided to wait on starting the drip at a much lower setting.Reason for block:procedure for pain

## 2018-09-13 NOTE — Anesthesia Preprocedure Evaluation (Addendum)
Anesthesia Evaluation  Patient identified by MRN, date of birth, ID band Patient awake    Reviewed: Allergy & Precautions, NPO status , Patient's Chart, lab work & pertinent test results  Airway Mallampati: II  TM Distance: >3 FB     Dental  (+) Teeth Intact   Pulmonary    Pulmonary exam normal        Cardiovascular negative cardio ROS Normal cardiovascular exam+ Valvular Problems/Murmurs      Neuro/Psych  Headaches, chronic negative psych ROS   GI/Hepatic negative GI ROS,   Endo/Other  diabetes, Gestational  Renal/GU negative Renal ROS  negative genitourinary   Musculoskeletal negative musculoskeletal ROS (+)   Abdominal Normal abdominal exam  (+)   Peds negative pediatric ROS (+)  Hematology  (+) anemia ,   Anesthesia Other Findings   Reproductive/Obstetrics (+) Pregnancy                            Anesthesia Physical Anesthesia Plan  ASA: II  Anesthesia Plan: Epidural   Post-op Pain Management:    Induction:   PONV Risk Score and Plan:   Airway Management Planned: Natural Airway  Additional Equipment:   Intra-op Plan:   Post-operative Plan:   Informed Consent: I have reviewed the patients History and Physical, chart, labs and discussed the procedure including the risks, benefits and alternatives for the proposed anesthesia with the patient or authorized representative who has indicated his/her understanding and acceptance.   Dental advisory given  Plan Discussed with: CRNA and Surgeon  Anesthesia Plan Comments:         Anesthesia Quick Evaluation

## 2018-09-13 NOTE — Progress Notes (Signed)
Sheila Mendez is a 32 y.o. G2P1001 at 608w5d by LMP admitted for induction of labor due to Gestational diabetes.  Subjective: Feeling slight pressure with contractions  Objective: BP 128/66   Pulse (!) 101   Temp 98.4 F (36.9 C) (Oral)   Resp (!) 22   Ht 5\' 3"  (1.6 m)   Wt 94.4 kg   LMP 12/09/2017   SpO2 96%   BMI 36.86 kg/m  I/O last 3 completed shifts: In: 1152.4 [I.V.:1152.4] Out: -  No intake/output data recorded.  FHT:  FHR: 148 bpm, variability: moderate,  accelerations:  Present,  decelerations:  Present variable decels with some contractions UC:   regular, every 4 minutes SVE:   Dilation: 5.5 Effacement (%): 80 Station: -1 Exam by:: [employer]  Labs: Lab Results  Component Value Date   WBC 10.3 09/12/2018   HGB 9.5 (L) 09/12/2018   HCT 29.5 (L) 09/12/2018   MCV 78.7 (L) 09/12/2018   PLT 257 09/12/2018    Assessment / Plan: Induction of labor due to gestational diabetes,  progressing well on pitocin  Labor: Progressing normally Preeclampsia:  labs stable Fetal Wellbeing:  Category I Pain Control:  Epidural I/D:  n/a Anticipated MOD:  NSVD  Melody N Shambley 09/13/2018, 7:40 AM

## 2018-09-13 NOTE — Progress Notes (Signed)
Fetal HR found in the 80's after epidural placement. Interventions as charted. Dr. Hal Hopearrol with anesthesia remains at bedside. MD notified. Late decelerations noted and MD, Evans aware.

## 2018-09-14 LAB — GLUCOSE, CAPILLARY: Glucose-Capillary: 74 mg/dL (ref 70–99)

## 2018-09-14 LAB — CBC
HCT: 26 % — ABNORMAL LOW (ref 36.0–46.0)
Hemoglobin: 8.1 g/dL — ABNORMAL LOW (ref 12.0–15.0)
MCH: 25 pg — ABNORMAL LOW (ref 26.0–34.0)
MCHC: 31.2 g/dL (ref 30.0–36.0)
MCV: 80.2 fL (ref 80.0–100.0)
Platelets: 224 10*3/uL (ref 150–400)
RBC: 3.24 MIL/uL — ABNORMAL LOW (ref 3.87–5.11)
RDW: 14.7 % (ref 11.5–15.5)
WBC: 13.3 10*3/uL — ABNORMAL HIGH (ref 4.0–10.5)
nRBC: 0 % (ref 0.0–0.2)

## 2018-09-14 LAB — TYPE AND SCREEN
ABO/RH(D): A NEG
ANTIBODY SCREEN: POSITIVE
Unit division: 0
Unit division: 0

## 2018-09-14 LAB — BPAM RBC
Blood Product Expiration Date: 202001282359
Blood Product Expiration Date: 202001282359
UNIT TYPE AND RH: 600
Unit Type and Rh: 600

## 2018-09-14 LAB — FETAL SCREEN: Fetal Screen: NEGATIVE

## 2018-09-14 MED ORDER — RHO D IMMUNE GLOBULIN 1500 UNIT/2ML IJ SOSY
300.0000 ug | PREFILLED_SYRINGE | Freq: Once | INTRAMUSCULAR | Status: AC
  Administered 2018-09-14: 300 ug via INTRAVENOUS
  Filled 2018-09-14: qty 2

## 2018-09-14 MED ORDER — LACTATED RINGERS IV SOLN
INTRAVENOUS | Status: DC
  Administered 2018-09-14: 1000 mL via INTRAVENOUS
  Administered 2018-09-14 – 2018-09-15 (×3): via INTRAVENOUS

## 2018-09-14 MED ORDER — FERROUS SULFATE 325 (65 FE) MG PO TABS
325.0000 mg | ORAL_TABLET | Freq: Three times a day (TID) | ORAL | Status: DC
  Administered 2018-09-14 – 2018-09-15 (×5): 325 mg via ORAL
  Filled 2018-09-14 (×5): qty 1

## 2018-09-14 NOTE — Progress Notes (Signed)
Patient states she still has a headache, and that ts "not bad." Patient drinking caffeine, taking tylenol and maintaining supine position. Patient previously refused blood patch after speaking with anesthesiologist. Patient on the fence about this. Will think about it overnight. RN to continue to monitor.

## 2018-09-14 NOTE — Anesthesia Postprocedure Evaluation (Signed)
Anesthesia Post Note  Patient: Sheila Mendez  Procedure(s) Performed: AN AD HOC LABOR EPIDURAL  Patient location during evaluation: Mother Baby Anesthesia Type: Epidural Level of consciousness: awake and alert Pain management: pain level controlled Vital Signs Assessment: post-procedure vital signs reviewed and stable Respiratory status: spontaneous breathing, nonlabored ventilation and respiratory function stable Cardiovascular status: stable Postop Assessment: no headache, no backache and epidural receding Anesthetic complications: no     Last Vitals:  Vitals:   09/13/18 2324 09/14/18 0742  BP: 125/79 104/70  Pulse: 89 78  Resp:  20  Temp:  36.7 C  SpO2:  99%    Last Pain:  Vitals:   09/14/18 1036  TempSrc:   PainSc: Asleep   Postural headache.  Will give fluids today, with tyelenol and caffeine. Possible blood patch in am.              Lynnett Langlinais S

## 2018-09-14 NOTE — Care Management (Signed)
Possible epidural blood patch in am, after trial of fluids, tylenol and caffeine.

## 2018-09-14 NOTE — Progress Notes (Signed)
Patient complaining of headache. Patient and husband state she "has been dealing with it all night." Patient states it comes and goes, and that she" took a shower and it felt fine." Patient states it does feel better when lying flat. RN spoke with anesthesiologist, Dr. Maisie Fushomas. Patient to be started back on LR, to lie flat, and to drink caffeineated beverages. Patient updated. Anesthesiologist to see patient today.

## 2018-09-14 NOTE — Progress Notes (Signed)
Patient ID: Sheila Mendez, female   DOB: 02-Jun-1986, 32 y.o.   MRN: [id number]  Post Partum Day # 1, s/p SVD, anemia, Rh negative-Rhogam given, spinal headache  Subjective:  Patient resting quietly in bed with eyes closed. Reports headache and back pain throughout the night. Notes relief of headache when lying flat in bed and mild relief of back pain with heating pad.   Denies difficulty breathing or respiratory distress, chest pain, abdominal pain, excessive vaginal bleeding, dysuria, and leg pain or swelling.   Objective:  Temp:  [97.4 F (36.3 C)-98.7 F (37.1 C)] 98.1 F (36.7 C) (12/28 0742) Pulse Rate:  [76-100] 78 (12/28 0742) Resp:  [16-20] 20 (12/28 0742) BP: (93-125)/(60-88) 104/70 (12/28 0742) SpO2:  [97 %-99 %] 99 % (12/28 0742)  Physical Exam:   General: alert and cooperative   Lungs: clear to auscultation bilaterally  Breasts: deferred, no complaints  Heart: normal apical impulse  Abdomen: soft, non-tender; bowel sounds normal; no masses,  no organomegaly  Pelvis: Lochia: appropriate, Uterine Fundus: firm  Extremities: DVT Evaluation: No evidence of DVT seen on physical exam.  Recent Labs    09/12/18 0944 09/14/18 0548  HGB 9.5* 8.1*  HCT 29.5* 26.0*    Assessment:  32 year old G2P2,  Post Partum Day # 1, s/p spontaneous vaginal birth, anemia, Rh negative-Rhogam given, spinal headache   Formula feeding  Plan:  Iron supplementation, see orders.  RN to contact anesthesia regarding headache management.  Reviewed red flag symptoms and when to call.    Continue orders as written. Reassess as needed.    LOS: 2 days    Gunnar BullaJenkins Michelle Glady Ouderkirk, CNM [facility] 09/14/2018 11:13 AM

## 2018-09-15 ENCOUNTER — Encounter: Payer: Self-pay | Admitting: Anesthesiology

## 2018-09-15 LAB — RHOGAM INJECTION: Unit division: 0

## 2018-09-15 LAB — GLUCOSE, CAPILLARY
Glucose-Capillary: 71 mg/dL (ref 70–99)
Glucose-Capillary: 66 mg/dL — ABNORMAL LOW (ref 70–99)
Glucose-Capillary: 78 mg/dL (ref 70–99)

## 2018-09-15 MED ORDER — FERROUS SULFATE 325 (65 FE) MG PO TABS: 325.0000 mg | ORAL_TABLET | Freq: Three times a day (TID) | ORAL | 3 refills | Status: DC

## 2018-09-15 MED ORDER — ACETAMINOPHEN 500 MG PO TABS: 1000.0000 mg | ORAL_TABLET | Freq: Four times a day (QID) | ORAL | 0 refills | Status: DC | PRN

## 2018-09-15 MED ORDER — SODIUM CHLORIDE 0.9 % IV SOLN
510.0000 mg | Freq: Once | INTRAVENOUS | Status: AC
  Administered 2018-09-15: 510 mg via INTRAVENOUS
  Filled 2018-09-15: qty 17

## 2018-09-15 NOTE — Progress Notes (Addendum)
Patient back to floor. Respiratory, cardiac, GI assessments WDL. Patient has no complaints of headache at this time. Patient refused to roll on side at this time; site of blood patch unassessed. Patient agrees to roll on side in 1 hour so RN can assess site. RN to sit patient up slowly at several intervals throughout the afternoon, per PACU RN. Patient HOB currently at 35 degrees. RN to continue to assess.

## 2018-09-15 NOTE — Progress Notes (Signed)
Patient called to use restroom. RN encouraged bed pan, per Dr. Maisie Fushomas. Patient refused stating "that's more trouble than its worth." Patient up to bathroom. No complaints of headache. Voided 500ml.

## 2018-09-15 NOTE — Care Management (Signed)
Headache continues although better. She agrees to a blood patch this am. She may have a light breakfast sthis am.

## 2018-09-15 NOTE — Discharge Instructions (Signed)
Care of a Perineal Tear A perineal tear is a cut or tear (laceration) in the tissue between the opening of the vagina and the anus (perineum). Some women develop a perineal tear during a vaginal birth. This can happen as the baby emerges from the birth canal and the perineum is stretched. There are four degrees of perineal tears based on how deep and long the laceration is:  First degree. This involves a shallow tear at the edge of the vaginal opening that extends slightly into the perineal skin.  Second degree. This involves tearing described in first degree perineal tear, and an additional deeper tear of the vaginal opening and perineal tissues. It may also include tearing of a muscle just under the perineal skin.  Third degree. This involves tearing described in first and second degree perineal tears, with the addition that tearing in the third degree extends into the muscle of the anus (anal sphincter).  Fourth degree. This involves all levels of tears described in first, second, and third degree perineal tears, with the tear in the fourth degree extending into the rectum. First and second degree perineal tears may or may not be stitched closed, depending on their location and appearance. Third and fourth degree perineal tears are stitched closed immediately after the babys birth. What are the risks? Depending on the type of perineal tear you have, you may be at risk for:  Bleeding.  Developing a collection of blood in the perineal tear area (hematoma).  Pain. This may include pain when you urinate, or pain when you have a bowel movement.  Infection at the site of the tear.  Fever.  Trouble controlling your urination or bowels (incontinence).  Painful sex. How to care for a perineal tear Wound care  Take a sitz bath as told by your health care provider. A sitz bath is a warm water bath that is taken while you are sitting down. The water should only come up to your hips and should  cover your buttocks. This can speed up healing. ? Partially fill a bathtub with warm water. You will only need the water to be deep enough to cover your hips and buttocks when you are sitting in it. ? If your health care provider told you to put medicine in the water, follow the directions exactly as told. ? Sit in the water and open the tub drain a little. ? Turn on the warm water again to keep the tub at the correct level. Keep the water running constantly. ? Soak in the water for 15-20 minutes or as told by your health care provider. ? After the sitz bath, pat the affected area dry first. Do not rub it. ? Be careful when you stand up after the sitz bath because you may feel dizzy.  Wash your hands before and after applying medicine to the area.  Wear a sanitary pad as told by your health care provider. Change the pad as often as told by your health care provider.  Leave stitches (sutures), skin glue, or adhesive strips in place. These skin closures may need to stay in place for 2 weeks or longer. If adhesive strip edges start to loosen and curl up, you may trim the loose edges. Do not remove adhesive strips completely unless your health care provider tells you to do that.  Check your wound every day for signs of infection. Check for: ? Redness, swelling, or pain. ? Fluid or blood. ? Warmth. ? Pus or a bad  smell. Managing pain  If directed, put ice on the painful area: ? Put ice in a plastic bag. ? Place a towel between your skin and the bag. ? Leave the ice on for 20 minutes, 2-3 times a day.  Apply a numbing spray to the perineal tear site as told by your health care provider. This may help with discomfort.  Take and apply over-the-counter and prescription medicines only as told by your health care provider.  If told, put about 3 witch hazel-containing hemorrhoid treatment pads on top of your sanitary pad. The witch hazel in the hemorrhoid pads helps with swelling and  discomfort.  Sit on an inflatable ring or pillow. This may provide comfort. General instructions  Squeeze warm water on your perineum after urinating. This should be done from front to back with a squeeze bottle. Pat the area to dry it.  Do not have sex, use tampons, or place anything in your vagina for at least 6 weeks or as told by your health care provider.  Keep all follow-up visits as told by your health care provider. These include any postpartum visits. This is important. Contact a health care provider if:  Your pain is not relieved with medicines.  You have painful urination.  You have redness, swelling, or pain around your tear.  You have fluid or blood coming from your tear.  Your tear feels warm to the touch.  You have pus or a bad smell coming from your tear.  You have a fever. Get help right away if:  Your tear opens.  You cannot urinate.  You have an increase in bleeding.  You have severe pain. Summary  A perineal tear is a cut or tear (laceration) in the tissue between the opening of the vagina and the anus (perineum).  There are four degrees of perineal tears based on how deep and long the laceration is.  First and second-degree perineal tears may or may not be stitched closed, depending on their location and appearance. Third and fourth- degree perineal tears are stitched closed immediately after the babys birth.  Follow your health care provider's instructions for caring for your perineal tear. Know how to manage pain and how to care for your wound. Know when to call your health care provider and when to seek immediate emergency care. This information is not intended to replace advice given to you by your health care provider. Make sure you discuss any questions you have with your health care provider. Document Released: 01/19/2014 Document Revised: 10/09/2016 Document Reviewed: 10/09/2016 Elsevier Interactive Patient Education  2019 [employer]. Home Care Instructions for Mom Activity  Gradually return to your regular activities.  Let yourself rest. Nap while your baby sleeps.  Avoid lifting anything that is heavier than 10 lb (4.5 kg) until your health care provider says it is okay.  Avoid activities that take a lot of effort and energy (are strenuous) until approved by your health care provider. Walking at a slow-to-moderate pace is usually safe.  If you had a cesarean delivery: ? Do not vacuum, climb stairs, or drive a car for 4-6 weeks. ? Have someone help you at home until you feel like you can do your usual activities yourself. ? Do exercises as told by your health care provider, if this applies. Vaginal bleeding You may continue to bleed for 4-6 weeks after delivery. Over time, the amount of blood usually decreases and the color of the blood usually gets lighter. However, the flow  of bright red blood may increase if you have been too active. If you need to use more than one pad in an hour because your pad gets soaked, or if you pass a large clot:  Lie down.  Raise your feet.  Place a cold compress on your lower abdomen.  Rest.  Call your health care provider. If you are breastfeeding, your period should return anytime between 8 weeks after delivery and the time that you stop breastfeeding. If you are not breastfeeding, your period should return 6-8 weeks after delivery. Perineal care The perineal area, or perineum, is the part of your body between your thighs. After delivery, this area needs special care. Follow these instructions as told by your health care provider.  Take warm tub baths for 15-20 minutes.  Use medicated pads and pain-relieving sprays and creams as told.  Do not use tampons or douches until vaginal bleeding has stopped.  Each time you go to the bathroom: ? Use a peri bottle. ? Change your pad. ? Use towelettes in place of toilet paper until your stitches have healed.  Do Kegel  exercises every day. Kegel exercises help to maintain the muscles that support the vagina, bladder, and bowels. You can do these exercises while you are standing, sitting, or lying down. To do Kegel exercises: ? Tighten the muscles of your abdomen and the muscles that surround your birth canal. ? Hold for a few seconds. ? Relax. ? Repeat until you have done this 5 times in a row.  To prevent hemorrhoids from developing or getting worse: ? Drink enough fluid to keep your urine clear or pale yellow. ? Avoid straining when having a bowel movement. ? Take over-the-counter medicines and stool softeners as told by your health care provider. Breast care  Wear a tight-fitting bra.  Avoid taking over-the-counter pain medicine for breast discomfort.  Apply ice to the breasts to help with discomfort as needed: ? Put ice in a plastic bag. ? Place a towel between your skin and the bag. ? Leave the ice on for 20 minutes or as told by your health care provider. Nutrition  Eat a well-balanced diet.  Do not try to lose weight quickly by cutting back on calories.  Take your prenatal vitamins until your postpartum checkup or until your health care provider tells you to stop. Postpartum depression You may find yourself crying for no apparent reason and unable to cope with all of the changes that come with having a newborn. This mood is called postpartum depression. Postpartum depression happens because your hormone levels change after delivery. If you have postpartum depression, get support from your partner, friends, and family. If the depression does not go away on its own after several weeks, contact your health care provider. Breast self-exam  Do a breast self-exam each month, at the same time of the month. If you are breastfeeding, check your breasts just after a feeding, when your breasts are less full. If you are breastfeeding and your period has started, check your breasts on day 5, 6, or 7 of  your period. Report any lumps, bumps, or discharge to your health care provider. Know that breasts are normally lumpy if you are breastfeeding. This is temporary, and it is not a health risk. Intimacy and sexuality Avoid sexual activity for at least 3-4 weeks after delivery or until the brownish-red vaginal flow is completely gone. If you want to avoid pregnancy, use some form of birth control. You can get pregnant  after delivery, even if you have not had your period. Contact a health care provider if:  You feel unable to cope with the changes that a child brings to your life, and these feelings do not go away after several weeks.  You notice a lump, a bump, or discharge on your breast. Get help right away if:  Blood soaks your pad in 1 hour or less.  You have: ? Severe pain or cramping in your lower abdomen. ? A bad-smelling vaginal discharge. ? A fever that is not controlled by medicine. ? A fever, and an area of your breast is red and sore. ? Pain or redness in your calf. ? Sudden, severe chest pain. ? Shortness of breath. ? Painful or bloody urination. ? Problems with your vision. ? You vomit for 12 hours or longer. ? You develop a severe headache. ? You have serious thoughts about hurting yourself, your child, or anyone else. This information is not intended to replace advice given to you by your health care provider. Make sure you discuss any questions you have with your health care provider. Document Released: 09/01/2000 Document Revised: 10/31/2017 Document Reviewed: 03/08/2015 Elsevier Interactive Patient Education  2019 [employer]   Postpartum Care After Vaginal Delivery This sheet gives you information about how to care for yourself from the time you deliver your baby to up to 6-12 weeks after delivery (postpartum period). Your health care provider may also give you more specific instructions. If you have problems or questions, contact your health care provider. Follow  these instructions at home: Vaginal bleeding  It is normal to have vaginal bleeding (lochia) after delivery. Wear a sanitary pad for vaginal bleeding and discharge. ? During the first week after delivery, the amount and appearance of lochia is often similar to a menstrual period. ? Over the next few weeks, it will gradually decrease to a dry, yellow-brown discharge. ? For most women, lochia stops completely by 4-6 weeks after delivery. Vaginal bleeding can vary from woman to woman.  Change your sanitary pads frequently. Watch for any changes in your flow, such as: ? A sudden increase in volume. ? A change in color. ? Large blood clots.  If you pass a blood clot from your vagina, save it and call your health care provider to discuss. Do not flush blood clots down the toilet before talking with your health care provider.  Do not use tampons or douches until your health care provider says this is safe.  If you are not breastfeeding, your period should return 6-8 weeks after delivery. If you are feeding your child breast milk only (exclusive breastfeeding), your period may not return until you stop breastfeeding. Perineal care  Keep the area between the vagina and the anus (perineum) clean and dry as told by your health care provider. Use medicated pads and pain-relieving sprays and creams as directed.  If you had a cut in the perineum (episiotomy) or a tear in the vagina, check the area for signs of infection until you are healed. Check for: ? More redness, swelling, or pain. ? Fluid or blood coming from the cut or tear. ? Warmth. ? Pus or a bad smell.  You may be given a squirt bottle to use instead of wiping to clean the perineum area after you go to the bathroom. As you start healing, you may use the squirt bottle before wiping yourself. Make sure to wipe gently.  To relieve pain caused by an episiotomy, a tear in  the vagina, or swollen veins in the anus (hemorrhoids), try taking a warm  sitz bath 2-3 times a day. A sitz bath is a warm water bath that is taken while you are sitting down. The water should only come up to your hips and should cover your buttocks. Breast care  Within the first few days after delivery, your breasts may feel heavy, full, and uncomfortable (breast engorgement). Milk may also leak from your breasts. Your health care provider can suggest ways to help relieve the discomfort. Breast engorgement should go away within a few days.  If you are breastfeeding: ? Wear a bra that supports your breasts and fits you well. ? Keep your nipples clean and dry. Apply creams and ointments as told by your health care provider. ? You may need to use breast pads to absorb milk that leaks from your breasts. ? You may have uterine contractions every time you breastfeed for up to several weeks after delivery. Uterine contractions help your uterus return to its normal size. ? If you have any problems with breastfeeding, work with your health care provider or Advertising copywriter.  If you are not breastfeeding: ? Avoid touching your breasts a lot. Doing this can make your breasts produce more milk. ? Wear a good-fitting bra and use cold packs to help with swelling. ? Do not squeeze out (express) milk. This causes you to make more milk. Intimacy and sexuality  Ask your health care provider when you can engage in sexual activity. This may depend on: ? Your risk of infection. ? How fast you are healing. ? Your comfort and desire to engage in sexual activity.  You are able to get pregnant after delivery, even if you have not had your period. If desired, talk with your health care provider about methods of birth control (contraception). Medicines  Take over-the-counter and prescription medicines only as told by your health care provider.  If you were prescribed an antibiotic medicine, take it as told by your health care provider. Do not stop taking the antibiotic even if you  start to feel better. Activity  Gradually return to your normal activities as told by your health care provider. Ask your health care provider what activities are safe for you.  Rest as much as possible. Try to rest or take a nap while your baby is sleeping. Eating and drinking   Drink enough fluid to keep your urine pale yellow.  Eat high-fiber foods every day. These may help prevent or relieve constipation. High-fiber foods include: ? Whole grain cereals and breads. ? Brown rice. ? Beans. ? Fresh fruits and vegetables.  Do not try to lose weight quickly by cutting back on calories.  Take your prenatal vitamins until your postpartum checkup or until your health care provider tells you it is okay to stop. Lifestyle  Do not use any products that contain nicotine or tobacco, such as cigarettes and e-cigarettes. If you need help quitting, ask your health care provider.  Do not drink alcohol, especially if you are breastfeeding. General instructions  Keep all follow-up visits for you and your baby as told by your health care provider. Most women visit their health care provider for a postpartum checkup within the first 3-6 weeks after delivery. Contact a health care provider if:  You feel unable to cope with the changes that your child brings to your life, and these feelings do not go away.  You feel unusually sad or worried.  Your breasts become red,  painful, or hard.  You have a fever.  You have trouble holding urine or keeping urine from leaking.  You have little or no interest in activities you used to enjoy.  You have not breastfed at all and you have not had a menstrual period for 12 weeks after delivery.  You have stopped breastfeeding and you have not had a menstrual period for 12 weeks after you stopped breastfeeding.  You have questions about caring for yourself or your baby.  You pass a blood clot from your vagina. Get help right away if:  You have chest  pain.  You have difficulty breathing.  You have sudden, severe leg pain.  You have severe pain or cramping in your lower abdomen.  You bleed from your vagina so much that you fill more than one sanitary pad in one hour. Bleeding should not be heavier than your heaviest period.  You develop a severe headache.  You faint.  You have blurred vision or spots in your vision.  You have bad-smelling vaginal discharge.  You have thoughts about hurting yourself or your baby. If you ever feel like you may hurt yourself or others, or have thoughts about taking your own life, get help right away. You can go to the nearest emergency department or call:  Your local emergency services (911 in the [place]  A suicide crisis helpline, such as the National Suicide Prevention Lifeline at 220-072-46771-857 571 2418. This is open 24 hours a day. Summary  The period of time right after you deliver your newborn up to 6-12 weeks after delivery is called the postpartum period.  Gradually return to your normal activities as told by your health care provider.  Keep all follow-up visits for you and your baby as told by your health care provider. This information is not intended to replace advice given to you by your health care provider. Make sure you discuss any questions you have with your health care provider. Document Released: 07/02/2007 Document Revised: 06/18/2017 Document Reviewed: 06/18/2017 Elsevier Interactive Patient Education  2019 [employer]   Postpartum Baby Blues The postpartum period begins right after the birth of a baby. During this time, there is often a lot of joy and excitement. It is also a time of many changes in the life of the parents. No matter how many times a mother gives birth, each child brings new challenges to the family, including different ways of relating to one another. It is common to have feelings of excitement along with confusing changes in moods, emotions, and thoughts. You  may feel happy one minute and sad or stressed the next. These feelings of sadness usually happen in the period right after you have your baby, and they go away within a week or two. This is called the "baby blues." What are the causes? There is no known cause of baby blues. It is likely caused by a combination of factors. However, changes in hormone levels after childbirth are believed to trigger some of the symptoms. Other factors that can play a role in these mood changes include:  Lack of sleep.  Stressful life events, such as poverty, caring for a loved one, or death of a loved one.  Genetics. What are the signs or symptoms? Symptoms of this condition include:  Brief changes in mood, such as going from extreme happiness to sadness.  Decreased concentration.  Difficulty sleeping.  Crying spells and tearfulness.  Loss of appetite.  Irritability.  Anxiety. If the symptoms of baby blues  last for more than 2 weeks or become more severe, you may have postpartum depression. How is this diagnosed? This condition is diagnosed based on an evaluation of your symptoms. There are no medical or lab tests that lead to a diagnosis, but there are various questionnaires that a health care provider may use to identify women with the baby blues or postpartum depression. How is this treated? Treatment is not needed for this condition. The baby blues usually go away on their own in 1-2 weeks. Social support is often all that is needed. You will be encouraged to get adequate sleep and rest. Follow these instructions at home: Lifestyle      Get as much rest as you can. Take a nap when the baby sleeps.  Exercise regularly as told by your health care provider. Some women find yoga and walking to be helpful.  Eat a balanced and nourishing diet. This includes plenty of fruits and vegetables, whole grains, and lean proteins.  Do little things that you enjoy. Have a cup of tea, take a bubble bath,  read your favorite magazine, or listen to your favorite music.  Avoid alcohol.  Ask for help with household chores, cooking, grocery shopping, or running errands. Do not try to do everything yourself. Consider hiring a postpartum doula to help. This is a professional who specializes in providing support to new mothers.  Try not to make any major life changes during pregnancy or right after giving birth. This can add stress. General instructions  Talk to people close to you about how you are feeling. Get support from your partner, family members, friends, or other new moms. You may want to join a support group.  Find ways to cope with stress. This may include: ? Writing your thoughts and feelings in a journal. ? Spending time outside. ? Spending time with people who make you laugh.  Try to stay positive in how you think. Think about the things you are grateful for.  Take over-the-counter and prescription medicines only as told by your health care provider.  Let your health care provider know if you have any concerns.  Keep all postpartum visits as told by your health care provider. This is important. Contact a health care provider if:  Your baby blues do not go away after 2 weeks. Get help right away if:  You have thoughts of taking your own life (suicidal thoughts).  You think you may harm the baby or other people.  You see or hear things that are not there (hallucinations). Summary  After giving birth, you may feel happy one minute and sad or stressed the next. Feelings of sadness that happen right after the baby is born and go away after a week or two are called the "baby blues."  You can manage the baby blues by getting enough rest, eating a healthy diet, exercising, spending time with supportive people, and finding ways to cope with stress.  If feelings of sadness and stress last longer than 2 weeks or get in the way of caring for your baby, talk to your health care  provider. This may mean you have postpartum depression. This information is not intended to replace advice given to you by your health care provider. Make sure you discuss any questions you have with your health care provider. Document Released: 06/08/2004 Document Revised: 10/31/2016 Document Reviewed: 10/31/2016 Elsevier Interactive Patient Education  2019 [employer].

## 2018-09-15 NOTE — Progress Notes (Signed)
Pt discharged home via WC accompanied by FOB and infant. Prior to discharge pt assisted to bathroom with no complications, no c/o HA dizziness or pain. All belongings with patient. Discharge instructions given and all questions answered. ID bands verified, cord clamp removed and security tag removed

## 2018-09-15 NOTE — Progress Notes (Signed)
Patient vitals stable and WDL this morning. Patient still complaining of headache 8/10 when sitting up. Patient states headache does not completely go away when lying flat but comes down to a 2-3/10. Patient taking PO pain medicine. Fundus firm, bleeding scant. Patient ok to have light breakfast, per Dr. Maisie Fushomas. Patient ate a few crackers with sips to drink. Voiding adequately. Anesthesiologist evaluated patient this morning.  Patient off unit for blood patch. Wheeled in bed by staff. Consent signed and in chart.

## 2018-09-15 NOTE — Care Management (Signed)
EPIDURAL BLOOD PATCH  Vital signs obtained by nurses. IV functioning well She was placed in a sitting position. Back prepped, #18 tuohy needle inserted via LOR technique. 20ml of blood obtained in sterile fashion by Michelet. Injected into epidural space. She had relief of neck ppain almost immediately. She was observed for 30 mins and sent back to floor.

## 2018-09-15 NOTE — Progress Notes (Signed)
Patient sitting at 45 degree angle currently. No complaints of headache. Patient to be moved to 60 degree angle at 0530. Iron infusion complete. Plan of care discussed with patient. Patient to be discharged later this evening.   Patient refused varicella and rubella vaccine.

## 2018-09-16 NOTE — Discharge Summary (Signed)
Obstetric Discharge Summary  Patient ID: Archie Pattenushenia Heatley MRN: [id number] DOB/AGE: March 13, 1986 32 y.o.   Date of Admission: 09/12/2018  Date of Discharge: 09/15/2018  Admitting Diagnosis: Induction of labor at 1712w5d  Secondary Diagnosis: Anemia in pregnancy, Gestational diabetes diet controlled (A1), RH negative status and Repaired third degree lacerations  Mode of Delivery: normal spontaneous vaginal delivery     Discharge Diagnosis: Spinal headache   Intrapartum Procedures: Atificial rupture of membranes, epidural, pitocin augmentation, placement of fetal scalp electrode, placement of intrauterine catheter and cytotec induction   Post partum procedures: Rhogam and Blood patch by Anesthesia  Complications: Repaired third degree perineal laceration and spinal headache   Brief Hospital Course   Archie Pattenushenia Mcwethy is a [id number] who had a SVD on 09/13/2018;  for further details of this birth, please refer to the delivey summary.  Postpartum course was complicated by a spinal headache requiring a blood patch by anesthesia; for further details of this procedure, please refer to MDA note.  By time of discharge on PPD#2, her pain was controlled on oral pain medications; she had appropriate lochia and was ambulating, voiding without difficulty and tolerating regular diet. Rhogam given by nursing staff. She was deemed stable for discharge to home.    Labs: CBC Latest Ref Rng & Units 09/14/2018 09/12/2018 06/21/2018  WBC 4.0 - 10.5 K/uL 13.3(H) 10.3 9.6  Hemoglobin 12.0 - 15.0 g/dL 8.1(L) 9.5(L) 10.6(L)  Hematocrit 36.0 - 46.0 % 26.0(L) 29.5(L) 31.7(L)  Platelets 150 - 400 K/uL 224 257 269   A NEG Performed at [facility], [address] [place], [place] [address]   Physical exam:   Temp:  [98 F (36.7 C)-98.5 F (36.9 C)] 98.5 F (36.9 C) (12/29 1613) Pulse Rate:  [60-70] 60 (12/29 1613) Resp:  [16-17] 16 (12/29 1613) BP: (113-130)/(63-77) 120/77 (12/29 1613) SpO2:  [95  %-99 %] 97 % (12/29 1613)  General: alert and no distress  Lochia: appropriate  Abdomen: soft, NT  Uterine Fundus: firm  Perineum: healing well, no significant drainage, no dehiscence, no significant erythema  Extremities: No evidence of DVT seen on physical exam. No lower extremity edema.  Discharge Instructions: Per After Visit Summary.  Activity: Advance as tolerated. Pelvic rest for 6 weeks.  Also refer to After Visit Summary  Diet: Regular  Medications:  Allergies as of 09/15/2018   No Known Allergies     Medication List    TAKE these medications   acetaminophen 500 MG tablet Commonly known as:  TYLENOL Take 2 tablets (1,000 mg total) by mouth every 6 (six) hours as needed (for pain scale < 4).   ferrous sulfate 325 (65 FE) MG tablet Take 1 tablet (325 mg total) by mouth 3 (three) times daily with meals.   prenatal multivitamin Tabs tablet Take 1 tablet by mouth daily at 12 noon.      Outpatient follow up:  Follow-up Information    Gunnar BullaLawhorn, Deauna Yaw Michelle, CNM. Go in 6 week(s).   Specialties:  Certified Nurse Midwife, Obstetrics and Gynecology, Radiology Why:  Follow up for six (6) week PPV Contact information: [address] [place] [address] [phone]          Postpartum contraception: abstinence  Discharged Condition: stable  Discharged to: home   Newborn Data:  Disposition:home with mother  Apgars: APGAR (1 MIN): 8   APGAR (5 MINS): 9    Baby Feeding: Bottle   Gunnar BullaJenkins Michelle Shanyce Daris, CNM [facility], [facility]

## 2018-09-18 ENCOUNTER — Encounter: Payer: Self-pay | Admitting: Certified Nurse Midwife

## 2018-09-19 ENCOUNTER — Other Ambulatory Visit: Payer: Self-pay | Admitting: Certified Nurse Midwife

## 2018-11-01 ENCOUNTER — Encounter: Payer: Self-pay | Admitting: Certified Nurse Midwife

## 2018-11-01 ENCOUNTER — Encounter: Payer: Medicaid Other | Admitting: Obstetrics and Gynecology

## 2018-11-01 ENCOUNTER — Ambulatory Visit (INDEPENDENT_AMBULATORY_CARE_PROVIDER_SITE_OTHER): Payer: Medicaid Other | Admitting: Certified Nurse Midwife

## 2018-11-01 NOTE — Patient Instructions (Addendum)
WE WOULD LOVE TO HEAR FROM YOU!!!!   Thank you Larey Days for visiting [facility].  Providing our patients with the best experience possible is really important to [place], and we hope that you felt that on your recent visit. The most valuable feedback we get comes from [place]!!    If you receive a survey please take a couple of minutes to let us know how we did.Thank you for continuing to trust [place] with your care.   [facility]  Etonogestrel implant What is this medicine? ETONOGESTREL (et oh noe JES trel) is a contraceptive (birth control) device. It is used to prevent pregnancy. It can be used for up to 3 years. This medicine may be used for other purposes; ask your health care provider or pharmacist if you have questions. COMMON BRAND NAME(S): Implanon, Nexplanon What should I tell my health care provider before I take this medicine? They need to know if you have any of these conditions: -abnormal vaginal bleeding -blood vessel disease or blood clots -breast, cervical, endometrial, ovarian, liver, or uterine cancer -diabetes -gallbladder disease -heart disease or recent heart attack -high blood pressure -high cholesterol or triglycerides -kidney disease -liver disease -migraine headaches -seizures -stroke -tobacco smoker -an unusual or allergic reaction to etonogestrel, anesthetics or antiseptics, other medicines, foods, dyes, or preservatives -pregnant or trying to get pregnant -breast-feeding How should I use this medicine? This device is inserted just under the skin on the inner side of your upper arm by a health care professional. Talk to your pediatrician regarding the use of this medicine in children. Special care may be needed. Overdosage: If you think you have taken too much of this medicine contact a poison control center or emergency room at once. NOTE: This medicine is only for you. Do not share this medicine with others. What  if I miss a dose? This does not apply. What may interact with this medicine? Do not take this medicine with any of the following medications: -amprenavir -fosamprenavir This medicine may also interact with the following medications: -acitretin -aprepitant -armodafinil -bexarotene -bosentan -carbamazepine -certain medicines for fungal infections like fluconazole, ketoconazole, itraconazole and voriconazole -certain medicines to treat hepatitis, HIV or AIDS -cyclosporine -felbamate -griseofulvin -lamotrigine -modafinil -oxcarbazepine -phenobarbital -phenytoin -primidone -rifabutin -rifampin -rifapentine -St. John's wort -topiramate This list may not describe all possible interactions. Give your health care provider a list of all the medicines, herbs, non-prescription drugs, or dietary supplements you use. Also tell them if you smoke, drink alcohol, or use illegal drugs. Some items may interact with your medicine. What should I watch for while using this medicine? This product does not protect you against HIV infection (AIDS) or other sexually transmitted diseases. You should be able to feel the implant by pressing your fingertips over the skin where it was inserted. Contact your doctor if you cannot feel the implant, and use a non-hormonal birth control method (such as condoms) until your doctor confirms that the implant is in place. Contact your doctor if you think that the implant may have broken or become bent while in your arm. You will receive a user card from your health care provider after the implant is inserted. The card is a record of the location of the implant in your upper arm and when it should be removed. Keep this card with your health records. What side effects may I notice from receiving this medicine? Side effects that you should report to your doctor or health care professional as  soon as possible: -allergic reactions like skin rash, itching or hives, swelling of  the face, lips, or tongue -breast lumps, breast tissue changes, or discharge -breathing problems -changes in emotions or moods -if you feel that the implant may have broken or bent while in your arm -high blood pressure -pain, irritation, swelling, or bruising at the insertion site -scar at site of insertion -signs of infection at the insertion site such as fever, and skin redness, pain or discharge -signs and symptoms of a blood clot such as breathing problems; changes in vision; chest pain; severe, sudden headache; pain, swelling, warmth in the leg; trouble speaking; sudden numbness or weakness of the face, arm or leg -signs and symptoms of liver injury like dark yellow or brown urine; general ill feeling or flu-like symptoms; light-colored stools; loss of appetite; nausea; right upper belly pain; unusually weak or tired; yellowing of the eyes or skin -unusual vaginal bleeding, discharge Side effects that usually do not require medical attention (report to your doctor or health care professional if they continue or are bothersome): -acne -breast pain or tenderness -headache -irregular menstrual bleeding -nausea This list may not describe all possible side effects. Call your doctor for medical advice about side effects. You may report side effects to FDA at 1-800-FDA-1088. Where should I keep my medicine? This drug is given in a hospital or clinic and will not be stored at home. NOTE: This sheet is a summary. It may not cover all possible information. If you have questions about this medicine, talk to your doctor, pharmacist, or health care provider.  2019 Elsevier/Gold Standard (2017-07-24 14:11:42)  Preventive Care 18-39 Years, Female Preventive care refers to lifestyle choices and visits with your health care provider that can promote health and wellness. What does preventive care include?   A yearly physical exam. This is also called an annual well check.  Dental exams once or  twice a year.  Routine eye exams. Ask your health care provider how often you should have your eyes checked.  Personal lifestyle choices, including: ? Daily care of your teeth and gums. ? Regular physical activity. ? Eating a healthy diet. ? Avoiding tobacco and drug use. ? Limiting alcohol use. ? Practicing safe sex. ? Taking vitamin and mineral supplements as recommended by your health care provider. What happens during an annual well check? The services and screenings done by your health care provider during your annual well check will depend on your age, overall health, lifestyle risk factors, and family history of disease. Counseling Your health care provider may ask you questions about your:  Alcohol use.  Tobacco use.  Drug use.  Emotional well-being.  Home and relationship well-being.  Sexual activity.  Eating habits.  Work and work Statistician.  Method of birth control.  Menstrual cycle.  Pregnancy history. Screening You may have the following tests or measurements:  Height, weight, and BMI.  Diabetes screening. This is done by checking your blood sugar (glucose) after you have not eaten for a while (fasting).  Blood pressure.  Lipid and cholesterol levels. These may be checked every 5 years starting at age 67.  Skin check.  Hepatitis C blood test.  Hepatitis B blood test.  Sexually transmitted disease (STD) testing.  BRCA-related cancer screening. This may be done if you have a family history of breast, ovarian, tubal, or peritoneal cancers.  Pelvic exam and Pap test. This may be done every 3 years starting at age 25. Starting at age 40, this 25  be done every 5 years if you have a Pap test in combination with an HPV test. Discuss your test results, treatment options, and if necessary, the need for more tests with your health care provider. Vaccines Your health care provider may recommend certain vaccines, such as:  Influenza vaccine. This is  recommended every year.  Tetanus, diphtheria, and acellular pertussis (Tdap, Td) vaccine. You may need a Td booster every 10 years.  Varicella vaccine. You may need this if you have not been vaccinated.  HPV vaccine. If you are 27 or younger, you may need three doses over 6 months.  Measles, mumps, and rubella (MMR) vaccine. You may need at least one dose of MMR. You may also need a second dose.  Pneumococcal 13-valent conjugate (PCV13) vaccine. You may need this if you have certain conditions and were not previously vaccinated.  Pneumococcal polysaccharide (PPSV23) vaccine. You may need one or two doses if you smoke cigarettes or if you have certain conditions.  Meningococcal vaccine. One dose is recommended if you are age 42-21 years and a first-year college student living in a residence hall, or if you have one of several medical conditions. You may also need additional booster doses.  Hepatitis A vaccine. You may need this if you have certain conditions or if you travel or work in places where you may be exposed to hepatitis A.  Hepatitis B vaccine. You may need this if you have certain conditions or if you travel or work in places where you may be exposed to hepatitis B.  Haemophilus influenzae type b (Hib) vaccine. You may need this if you have certain risk factors. Talk to your health care provider about which screenings and vaccines you need and how often you need them. This information is not intended to replace advice given to you by your health care provider. Make sure you discuss any questions you have with your health care provider. Document Released: 10/31/2001 Document Revised: 04/17/2017 Document Reviewed: 07/06/2015 Elsevier Interactive Patient Education  2019 [employer].

## 2018-11-01 NOTE — Progress Notes (Addendum)
Subjective:    Sheila Mendez is a 33 y.o. [id number] Caucasian female who presents for a postpartum visit. She is 6 weeks postpartum following a spontaneous vaginal delivery at 39+5 gestational weeks. Anesthesia: epidural. I have fully reviewed the prenatal and intrapartum course.   Postpartum course has been uncomplicated. Baby's course has been uncomplicated. Baby is feeding by formula. Bleeding no bleeding. Bowel function is normal. Bladder function is normal.   Patient is sexually active. Contraception method is coitus interruptus; desires Nexplanon placement. Postpartum depression screening: positive. Score 12.  Last pap 02/2018 and was Negative/Negative.  Denies difficulty breathing or respiratory distress, chest pain, abdominal pain, excessive vaginal bleeding, dysuria, and leg pain or swelling.   The following portions of the patient's history were reviewed and updated as appropriate: allergies, current medications, past medical history, past surgical history and problem list.  Review of Systems  Pertinent items are noted in HPI.   Objective:   BP 124/68   Pulse 63   Ht 5\' 4"  (1.626 m)   Wt 181 lb 14.4 oz (82.5 kg)   LMP 10/21/2018 (Exact Date)   Breastfeeding No   BMI 31.22 kg/m   General:  alert, cooperative and no distress   Breasts:  deferred, no complaints  Lungs: clear to auscultation bilaterally  Heart:  regular rate and rhythm  Abdomen: soft, nontender   Vulva: normal  Vagina: normal vagina  Cervix:  closed  Corpus: Well-involuted  Adnexa:  Non-palpable        Depression screen [facility] 2/9 11/01/2018 08/02/2018  Decreased Interest 1 0  Down, Depressed, Hopeless 1 1  PHQ - 2 Score 2 1  Altered sleeping 1 -  Tired, decreased energy 2 -  Change in appetite 1 -  Feeling bad or failure about yourself  1 -  Trouble concentrating 3 -  Moving slowly or fidgety/restless 2 -  Suicidal thoughts 0 -  PHQ-9 Score 12 -  Difficult doing work/chores Very difficult -    Assessment:   Postpartum exam Six (6) wks s/p spontaneous vaginal birth Formula feeding Depression screening Contraception counseling   Plan:   Support, watchful waiting.   May return to work without restriction.   Encouraged routine health maintenance techniques.   Follow up in: 1 week for Nexplanon insertion or earlier if needed.    Gunnar Bulla, CNM [facility], [facility] 11/01/18 4:49 PM

## 2018-11-01 NOTE — Progress Notes (Signed)
Patient here for 6 week postpartum visit.  No complaints.

## 2018-11-08 ENCOUNTER — Ambulatory Visit (INDEPENDENT_AMBULATORY_CARE_PROVIDER_SITE_OTHER): Payer: Medicaid Other | Admitting: Certified Nurse Midwife

## 2018-11-08 ENCOUNTER — Encounter: Payer: Self-pay | Admitting: Certified Nurse Midwife

## 2018-11-08 DIAGNOSIS — Z3202 Encounter for pregnancy test, result negative: Secondary | ICD-10-CM | POA: Diagnosis not present

## 2018-11-08 DIAGNOSIS — Z3049 Encounter for surveillance of other contraceptives: Secondary | ICD-10-CM | POA: Diagnosis not present

## 2018-11-08 LAB — POCT URINE PREGNANCY: PREG TEST UR: NEGATIVE

## 2018-11-08 NOTE — Progress Notes (Signed)
Sheila Mendez is a 33 y.o. year old Caucasian female here for Nexplanon insertion.  Risks/benefits/side effects of Nexplanon have been discussed and her questions have been answered.  Specifically, a failure rate of 09/998 has been reported, with an increased failure rate if pt takes St. John's Wort and/or antiseizure medicaitons.  [name] is aware of the common side effect of irregular bleeding, which the incidence of decreases over time.  BP 112/71   Pulse (!) 57   Ht 5\' 3"  (1.6 m)   Wt 183 lb 4.8 oz (83.1 kg)   LMP 10/21/2018 (Exact Date)   BMI 32.47 kg/m   Results for orders placed or performed in visit on 11/08/18 (from the past 24 hour(s))  POCT urine pregnancy   Collection Time: 11/08/18  3:28 PM  Result Value Ref Range   Preg Test, Ur Negative Negative     She is right-handed, so her left arm, approximately 4 inches proximal from the elbow, was cleansed with alcohol and anesthetized with 2cc of 2% Lidocaine.  The area was cleansed again with betadine and the Nexplanon was inserted per manufacturer's recommendations without difficulty.  A steri-strip and pressure bandage were applied.  Pt was instructed to keep the area clean and dry, remove pressure bandage in 24 hours, and keep insertion site covered with the steri-strip for 3-5 days.  Back up contraception was recommended for 2 weeks.  She was given a card indicating date Nexplanon was inserted and date it needs to be removed.   Reviewed red flag symptoms and when to call.   RTC as needed.    Gunnar Bulla, CNM [facility], [facility] 11/08/18 4:03 PM    NDC: 2536-6440-34 Lot: [id number] Exp: 02/2021

## 2018-11-08 NOTE — Progress Notes (Signed)
Patient comes in today for insertion of nexplanon.

## 2018-11-08 NOTE — Patient Instructions (Signed)
Nexplanon Instructions After Insertion   Keep bandage clean and dry for 24 hours   May use ice/Tylenol/Ibuprofen for soreness or pain   If you develop fever, drainage or increased warmth from incision site-contact office immediately Etonogestrel implant What is this medicine? ETONOGESTREL (et oh noe JES trel) is a contraceptive (birth control) device. It is used to prevent pregnancy. It can be used for up to 3 years. This medicine may be used for other purposes; ask your health care provider or pharmacist if you have questions. COMMON BRAND NAME(S): Implanon, Nexplanon What should I tell my health care provider before I take this medicine? They need to know if you have any of these conditions: -abnormal vaginal bleeding -blood vessel disease or blood clots -breast, cervical, endometrial, ovarian, liver, or uterine cancer -diabetes -gallbladder disease -heart disease or recent heart attack -high blood pressure -high cholesterol or triglycerides -kidney disease -liver disease -migraine headaches -seizures -stroke -tobacco smoker -an unusual or allergic reaction to etonogestrel, anesthetics or antiseptics, other medicines, foods, dyes, or preservatives -pregnant or trying to get pregnant -breast-feeding How should I use this medicine? This device is inserted just under the skin on the inner side of your upper arm by a health care professional. Talk to your pediatrician regarding the use of this medicine in children. Special care may be needed. Overdosage: If you think you have taken too much of this medicine contact a poison control center or emergency room at once. NOTE: This medicine is only for you. Do not share this medicine with others. What if I miss a dose? This does not apply. What may interact with this medicine? Do not take this medicine with any of the following medications: -amprenavir -fosamprenavir This medicine may also interact with the following  medications: -acitretin -aprepitant -armodafinil -bexarotene -bosentan -carbamazepine -certain medicines for fungal infections like fluconazole, ketoconazole, itraconazole and voriconazole -certain medicines to treat hepatitis, HIV or AIDS -cyclosporine -felbamate -griseofulvin -lamotrigine -modafinil -oxcarbazepine -phenobarbital -phenytoin -primidone -rifabutin -rifampin -rifapentine -St. John's wort -topiramate This list may not describe all possible interactions. Give your health care provider a list of all the medicines, herbs, non-prescription drugs, or dietary supplements you use. Also tell them if you smoke, drink alcohol, or use illegal drugs. Some items may interact with your medicine. What should I watch for while using this medicine? This product does not protect you against HIV infection (AIDS) or other sexually transmitted diseases. You should be able to feel the implant by pressing your fingertips over the skin where it was inserted. Contact your doctor if you cannot feel the implant, and use a non-hormonal birth control method (such as condoms) until your doctor confirms that the implant is in place. Contact your doctor if you think that the implant may have broken or become bent while in your arm. You will receive a user card from your health care provider after the implant is inserted. The card is a record of the location of the implant in your upper arm and when it should be removed. Keep this card with your health records. What side effects may I notice from receiving this medicine? Side effects that you should report to your doctor or health care professional as soon as possible: -allergic reactions like skin rash, itching or hives, swelling of the face, lips, or tongue -breast lumps, breast tissue changes, or discharge -breathing problems -changes in emotions or moods -if you feel that the implant may have broken or bent while in your arm -high blood    pressure -pain, irritation, swelling, or bruising at the insertion site -scar at site of insertion -signs of infection at the insertion site such as fever, and skin redness, pain or discharge -signs and symptoms of a blood clot such as breathing problems; changes in vision; chest pain; severe, sudden headache; pain, swelling, warmth in the leg; trouble speaking; sudden numbness or weakness of the face, arm or leg -signs and symptoms of liver injury like dark yellow or brown urine; general ill feeling or flu-like symptoms; light-colored stools; loss of appetite; nausea; right upper belly pain; unusually weak or tired; yellowing of the eyes or skin -unusual vaginal bleeding, discharge Side effects that usually do not require medical attention (report to your doctor or health care professional if they continue or are bothersome): -acne -breast pain or tenderness -headache -irregular menstrual bleeding -nausea This list may not describe all possible side effects. Call your doctor for medical advice about side effects. You may report side effects to FDA at 1-800-FDA-1088. Where should I keep my medicine? This drug is given in a hospital or clinic and will not be stored at home. NOTE: This sheet is a summary. It may not cover all possible information. If you have questions about this medicine, talk to your doctor, pharmacist, or health care provider.  2019 Elsevier/Gold Standard (2017-07-24 14:11:42)   

## 2019-01-20 ENCOUNTER — Telehealth: Payer: Self-pay | Admitting: Certified Nurse Midwife

## 2019-01-20 NOTE — Telephone Encounter (Signed)
The patient called and stated that she no longer wants her Nexplanon in and would like to schedule an appointment for removal. The patient did not state that she is having any issues. The patient was informed that a nurse will review and approve scheduling. Please advise.

## 2019-01-20 NOTE — Telephone Encounter (Signed)
Please schedule visit for removal. Thanks, JML

## 2019-01-23 ENCOUNTER — Encounter: Payer: Self-pay | Admitting: *Deleted

## 2019-01-23 ENCOUNTER — Telehealth: Payer: Self-pay

## 2019-01-23 NOTE — Telephone Encounter (Signed)
Coronavirus (COVID-19) Are you at risk?  Are you at risk for the Coronavirus (COVID-19)?  To be considered HIGH RISK for Coronavirus (COVID-19), you have to meet the following criteria:  . Traveled to [place], [place], [place], [place] or [place]; or in the [place] to [place], [place], [place], or [place]; and have fever, cough, and shortness of breath within the last 2 weeks of travel OR . Been in close contact with a person diagnosed with COVID-19 within the last 2 weeks and have fever, cough, and shortness of breath . IF YOU DO NOT MEET THESE CRITERIA, YOU ARE CONSIDERED LOW RISK FOR COVID-19.  What to do if you are HIGH RISK for COVID-19?  . If you are having a medical emergency, call 911. . Seek medical care right away. Before you go to a doctor's office, urgent care or emergency department, call ahead and tell them about your recent travel, contact with someone diagnosed with COVID-19, and your symptoms. You should receive instructions from your physician's office regarding next steps of care.  . When you arrive at healthcare provider, tell the healthcare staff immediately you have returned from visiting [place], [place], [place], [place] or [place]; or traveled in the [place] to [place], [place], [place], or [place]; in the last two weeks or you have been in close contact with a person diagnosed with COVID-19 in the last 2 weeks.   . Tell the health care staff about your symptoms: fever, cough and shortness of breath. . After you have been seen by a medical provider, you will be either: o Tested for (COVID-19) and discharged home on quarantine except to seek medical care if symptoms worsen, and asked to  - Stay home and avoid contact with others until you get your results (4-5 days)  - Avoid travel on public transportation if possible (such as bus, train, or airplane) or o Sent to the Emergency Department by EMS for evaluation, COVID-19 testing, and possible  admission depending on your condition and test results.  What to do if you are LOW RISK for COVID-19?  Reduce your risk of any infection by using the same precautions used for avoiding the common cold or flu:  . Wash your hands often with soap and warm water for at least 20 seconds.  If soap and water are not readily available, use an alcohol-based hand sanitizer with at least 60% alcohol.  . If coughing or sneezing, cover your mouth and nose by coughing or sneezing into the elbow areas of your shirt or coat, into a tissue or into your sleeve (not your hands). . Avoid shaking hands with others and consider head nods or verbal greetings only. . Avoid touching your eyes, nose, or mouth with unwashed hands.  . Avoid close contact with people who are sick. . Avoid places or events with large numbers of people in one location, like concerts or sporting events. . Carefully consider travel plans you have or are making. . If you are planning any travel outside or inside the [place], visit the CDC's Travelers' Health webpage for the latest health notices. . If you have some symptoms but not all symptoms, continue to monitor at home and seek medical attention if your symptoms worsen. . If you are having a medical emergency, call 911.   ADDITIONAL HEALTHCARE OPTIONS FOR PATIENTS  [facility] / e-Visit: [email or web address]         [facility]: [phone]  [facility]: [phone]                   [facility]: [phone]   Pre-screen negative, DM.   

## 2019-01-24 ENCOUNTER — Ambulatory Visit (INDEPENDENT_AMBULATORY_CARE_PROVIDER_SITE_OTHER): Payer: Medicaid Other | Admitting: Certified Nurse Midwife

## 2019-01-24 ENCOUNTER — Other Ambulatory Visit: Payer: Self-pay

## 2019-01-24 VITALS — BP 118/78 | HR 109 | Ht 63.0 in | Wt 188.6 lb

## 2019-01-24 DIAGNOSIS — Z3049 Encounter for surveillance of other contraceptives: Secondary | ICD-10-CM | POA: Diagnosis not present

## 2019-01-24 DIAGNOSIS — Z3009 Encounter for other general counseling and advice on contraception: Secondary | ICD-10-CM

## 2019-01-24 DIAGNOSIS — Z3046 Encounter for surveillance of implantable subdermal contraceptive: Secondary | ICD-10-CM

## 2019-01-24 DIAGNOSIS — R6882 Decreased libido: Secondary | ICD-10-CM

## 2019-01-24 NOTE — Progress Notes (Signed)
Sheila Mendez is a 33 y.o. year old Caucasian female here for Nexplanon removal due to irregular bleeding and low libido.  Patient given informed consent for removal of he Nexplanon.  BP 118/78   Pulse (!) 109   Ht 5\' 3"  (1.6 m)   Wt 188 lb 9 oz (85.5 kg)   BMI 33.40 kg/m   Appropriate time out taken. Implanon site identified.  Area prepped in usual sterile fashon. One cc of 2% lidocaine was used to anesthetize the area at the distal end of the implant. A small stab incision was made right beside the implant on the distal portion.  The Implanon rod was grasped using hemostats and removed without difficulty.  There was less than 3 cc blood loss. There were no complications.  Steri-strips were applied over the small incision and a pressure bandage was applied.  The patient tolerated the procedure well.  She was instructed to keep the area clean and dry, remove pressure bandage in 24 hours, and keep insertion site covered with the steri-strip for 3-5 days.    Samples of Lo Loestrin given.   Reviewed red flag symptoms and when to call.   RTC x 1 months for labs or sooner if needed.    Gunnar Bulla, CNM [facility], [facility] 01/24/19 2:03 PM

## 2019-01-24 NOTE — Patient Instructions (Addendum)
Ethinyl Estradiol; Norethindrone Acetate; Ferrous fumarate tablets or capsules What is this medicine? ETHINYL ESTRADIOL; NORETHINDRONE ACETATE; FERROUS FUMARATE (ETH in il es tra DYE ole; nor eth IN drone AS e tate; FER [place] FUE ma rate) is an oral contraceptive. The products combine two types of female hormones, an estrogen and a progestin. They are used to prevent ovulation and pregnancy. Some products are also used to treat acne in females. This medicine may be used for other purposes; ask your health care provider or pharmacist if you have questions. COMMON BRAND NAME(S): Aurovela [address] 1/20, Aurovela Fe, Blisovi [address] Fe, Estrostep Fe, Gildess [address], [address] Fe 1.5/30, Gildess Fe 1/20, Hailey 24 Fe, Junel Fe 1.5/30, Junel Fe 1/20, Junel Fe 24, Larin Fe, Lo Loestrin Fe, Loestrin 24 Fe, Loestrin FE 1.5/30, Loestrin FE 1/20, Lomedia 24 Fe, Microgestin 24 Fe, Microgestin Fe 1.5/30, Microgestin Fe 1/20, Tarina 24 Fe, Tarina Fe 1/20, Taytulla, Tilia Fe, Tri-Legest Fe What should I tell my health care provider before I take this medicine? They need to know if you have any of these conditions: -abnormal vaginal bleeding -blood vessel disease -breast, cervical, endometrial, ovarian, liver, or uterine cancer -diabetes -gallbladder disease -heart disease or recent heart attack -high blood pressure -high cholesterol -history of blood clots -kidney disease -liver disease -migraine headaches -smoke tobacco -stroke -systemic lupus erythematosus (SLE) -an unusual or allergic reaction to estrogens, progestins, other medicines, foods, dyes, or preservatives -pregnant or trying to get pregnant -breast-feeding How should I use this medicine? Take this medicine by mouth. To reduce nausea, this medicine may be taken with food. Follow the directions on the prescription label. Take this medicine at the same time each day and in the order directed on the package. Do not take your medicine more often  than directed. A patient package insert for the product will be given with each prescription and refill. Read this sheet carefully each time. The sheet may change frequently. Contact your pediatrician regarding the use of this medicine in children. Special care may be needed. This medicine has been used in female children who have started having menstrual periods. Overdosage: If you think you have taken too much of this medicine contact a poison control center or emergency room at once. NOTE: This medicine is only for you. Do not share this medicine with others. What if I miss a dose? If you miss a dose, refer to the patient information sheet you received with your medicine for direction. If you miss more than one pill, this medicine may not be as effective and you may need to use another form of birth control. What may interact with this medicine? Do not take this medicine with the following medication: -dasabuvir; ombitasvir; paritaprevir; ritonavir -ombitasvir; paritaprevir; ritonavir This medicine may also interact with the following medications: -acetaminophen -antibiotics or medicines for infections, especially rifampin, rifabutin, rifapentine, and griseofulvin, and possibly penicillins or tetracyclines -aprepitant -ascorbic acid (vitamin C) -atorvastatin -barbiturate medicines, such as phenobarbital -bosentan -carbamazepine -caffeine -clofibrate -cyclosporine -dantrolene -doxercalciferol -felbamate -grapefruit juice -hydrocortisone -medicines for anxiety or sleeping problems, such as diazepam or temazepam -medicines for diabetes, including pioglitazone -mineral oil -modafinil -mycophenolate -nefazodone -oxcarbazepine -phenytoin -prednisolone -ritonavir or other medicines for HIV infection or AIDS -rosuvastatin -selegiline -soy isoflavones supplements -St. John's wort -tamoxifen or raloxifene -theophylline -thyroid hormones -topiramate -warfarin This list may not  describe all possible interactions. Give your health care provider a list of all the medicines, herbs, non-prescription drugs, or dietary supplements you use.  Also tell them if you smoke, drink alcohol, or use illegal drugs. Some items may interact with your medicine. What should I watch for while using this medicine? Visit your doctor or health care professional for regular checks on your progress. You will need a regular breast and pelvic exam and Pap smear while on this medicine. Use an additional method of contraception during the first cycle that you take these tablets. If you have any reason to think you are pregnant, stop taking this medicine right away and contact your doctor or health care professional. If you are taking this medicine for hormone related problems, it may take several cycles of use to see improvement in your condition. Smoking increases the risk of getting a blood clot or having a stroke while you are taking birth control pills, especially if you are more than 35 years old. You are strongly advised not to smoke. This medicine can make your body retain fluid, making your fingers, hands, or ankles swell. Your blood pressure can go up. Contact your doctor or health care professional if you feel you are retaining fluid. This medicine can make you more sensitive to the sun. Keep out of the sun. If you cannot avoid being in the sun, wear protective clothing and use sunscreen. Do not use sun lamps or tanning beds/booths. If you wear contact lenses and notice visual changes, or if the lenses begin to feel uncomfortable, consult your eye care specialist. In some women, tenderness, swelling, or minor bleeding of the gums may occur. Notify your dentist if this happens. Brushing and flossing your teeth regularly may help limit this. See your dentist regularly and inform your dentist of the medicines you are taking. If you are going to have elective surgery, you may need to stop taking this  medicine before the surgery. Consult your health care professional for advice. This medicine does not protect you against HIV infection (AIDS) or any other sexually transmitted diseases. What side effects may I notice from receiving this medicine? Side effects that you should report to your doctor or health care professional as soon as possible: -allergic reactions like skin rash, itching or hives, swelling of the face, lips, or tongue -breast tissue changes or discharge -changes in vaginal bleeding during your period or between your periods -changes in vision -chest pain -confusion -coughing up blood -dizziness -feeling faint or lightheaded -headaches or migraines -leg, arm or groin pain -loss of balance or coordination -severe or sudden headaches -stomach pain (severe) -sudden shortness of breath -sudden numbness or weakness of the face, arm or leg -symptoms of vaginal infection like itching, irritation or unusual discharge -tenderness in the upper abdomen -trouble speaking or understanding -vomiting -yellowing of the eyes or skin Side effects that usually do not require medical attention (report to your doctor or health care professional if they continue or are bothersome): -breakthrough bleeding and spotting that continues beyond the 3 initial cycles of pills -breast tenderness -mood changes, anxiety, depression, frustration, anger, or emotional outbursts -increased sensitivity to sun or ultraviolet light -nausea -skin rash, acne, or brown spots on the skin -weight gain (slight) This list may not describe all possible side effects. Call your doctor for medical advice about side effects. You may report side effects to FDA at 1-800-FDA-1088. Where should I keep my medicine? Keep out of the reach of children. Store at room temperature between 15 and 30 degrees C (59 and 86 degrees F). Throw away any unused medicine after the expiration date. NOTE: This  sheet is a summary. It may  not cover all possible information. If you have questions about this medicine, talk to your doctor, pharmacist, or health care provider.  2019 Elsevier/Gold Standard (2016-05-15 08:04:41)   Contraception Choices Contraception, also called birth control, means things to use or ways to try not to get pregnant. Hormonal birth control This kind of birth control uses hormones. Here are some types of hormonal birth control:  A tube that is put under skin of the arm (implant). The tube can stay in for as long as 3 years.  Shots to get every 3 months (injections).  Pills to take every day (birth control pills).  A patch to change 1 time each week for 3 weeks (birth control patch). After that, the patch is taken off for 1 week.  A ring to put in the vagina. The ring is left in for 3 weeks. Then it is taken out of the vagina for 1 week. Then a new ring is put in.  Pills to take after unprotected sex (emergency birth control pills). Barrier birth control Here are some types of barrier birth control:  A thin covering that is put on the penis before sex (female condom). The covering is thrown away after sex.  A soft, loose covering that is put in the vagina before sex (female condom). The covering is thrown away after sex.  A rubber bowl that sits over the cervix (diaphragm). The bowl must be made for you. The bowl is put into the vagina before sex. The bowl is left in for 6-8 hours after sex. It is taken out within 24 hours.  A small, soft cup that fits over the cervix (cervical cap). The cup must be made for you. The cup can be left in for 6-8 hours after sex. It is taken out within 48 hours.  A sponge that is put into the vagina before sex. It must be left in for at least 6 hours after sex. It must be taken out within 30 hours. Then it is thrown away.  A chemical that kills or stops sperm from getting into the uterus (spermicide). It may be a pill, cream, jelly, or foam to put in the vagina.  The chemical should be used at least 10-15 minutes before sex. IUD (intrauterine) birth control An IUD is a small, T-shaped piece of plastic. It is put inside the uterus. There are two kinds:  Hormone IUD. This kind can stay in for 3-5 years.  Copper IUD. This kind can stay in for 10 years. Permanent birth control Here are some types of permanent birth control:  Surgery to block the fallopian tubes.  Having an insert put into each fallopian tube.  Surgery to tie off the tubes that carry sperm (vasectomy). Natural planning birth control Here are some types of natural planning birth control:  Not having sex on the days the woman could get pregnant.  Using a calendar: ? To keep track of the length of each period. ? To find out what days pregnancy can happen. ? To plan to not have sex on days when pregnancy can happen.  Watching for symptoms of ovulation and not having sex during ovulation. One way the woman can check for ovulation is to check her temperature.  Waiting to have sex until after ovulation. Summary  Contraception, also called birth control, means things to use or ways to try not to get pregnant.  Hormonal methods of birth control include implants, injections, pills, patches,  vaginal rings, and emergency birth control pills.  Barrier methods of birth control can include female condoms, female condoms, diaphragms, cervical caps, sponges, and spermicides.  There are two types of IUD (intrauterine device) birth control. An IUD can be put in a woman's uterus to prevent pregnancy for 3-5 years.  Permanent sterilization can be done through a procedure for males, females, or both.  Natural planning methods involve not having sex on the days when the woman could get pregnant. This information is not intended to replace advice given to you by your health care provider. Make sure you discuss any questions you have with your health care provider. Document Released: 07/02/2009  Document Revised: 04/11/2018 Document Reviewed: 09/14/2016 Elsevier Interactive Patient Education  2019 [employer].

## 2019-02-17 ENCOUNTER — Other Ambulatory Visit: Payer: Self-pay

## 2019-02-17 ENCOUNTER — Encounter: Payer: Self-pay | Admitting: Certified Nurse Midwife

## 2019-02-17 MED ORDER — NORETHIN-ETH ESTRAD-FE BIPHAS 1 MG-10 MCG / 10 MCG PO TABS: 1.0000 | ORAL_TABLET | Freq: Every day | ORAL | 11 refills | Status: DC

## 2020-03-18 DIAGNOSIS — Z419 Encounter for procedure for purposes other than remedying health state, unspecified: Secondary | ICD-10-CM | POA: Diagnosis not present

## 2020-04-18 DIAGNOSIS — Z419 Encounter for procedure for purposes other than remedying health state, unspecified: Secondary | ICD-10-CM | POA: Diagnosis not present

## 2020-05-19 DIAGNOSIS — Z419 Encounter for procedure for purposes other than remedying health state, unspecified: Secondary | ICD-10-CM | POA: Diagnosis not present

## 2020-06-18 DIAGNOSIS — Z419 Encounter for procedure for purposes other than remedying health state, unspecified: Secondary | ICD-10-CM | POA: Diagnosis not present

## 2020-07-19 DIAGNOSIS — Z419 Encounter for procedure for purposes other than remedying health state, unspecified: Secondary | ICD-10-CM | POA: Diagnosis not present

## 2020-08-18 DIAGNOSIS — Z419 Encounter for procedure for purposes other than remedying health state, unspecified: Secondary | ICD-10-CM | POA: Diagnosis not present

## 2020-09-18 DIAGNOSIS — Z419 Encounter for procedure for purposes other than remedying health state, unspecified: Secondary | ICD-10-CM | POA: Diagnosis not present

## 2020-10-19 DIAGNOSIS — Z419 Encounter for procedure for purposes other than remedying health state, unspecified: Secondary | ICD-10-CM | POA: Diagnosis not present

## 2020-11-08 ENCOUNTER — Encounter: Payer: Self-pay | Admitting: Certified Nurse Midwife

## 2020-11-08 ENCOUNTER — Other Ambulatory Visit: Payer: Self-pay

## 2020-11-08 ENCOUNTER — Ambulatory Visit (INDEPENDENT_AMBULATORY_CARE_PROVIDER_SITE_OTHER): Payer: Medicaid Other | Admitting: Certified Nurse Midwife

## 2020-11-08 VITALS — BP 113/76 | HR 77 | Ht 63.0 in | Wt 198.2 lb

## 2020-11-08 DIAGNOSIS — R6882 Decreased libido: Secondary | ICD-10-CM | POA: Diagnosis not present

## 2020-11-08 DIAGNOSIS — M545 Low back pain, unspecified: Secondary | ICD-10-CM

## 2020-11-08 DIAGNOSIS — R635 Abnormal weight gain: Secondary | ICD-10-CM | POA: Diagnosis not present

## 2020-11-08 DIAGNOSIS — Z01419 Encounter for gynecological examination (general) (routine) without abnormal findings: Secondary | ICD-10-CM | POA: Diagnosis not present

## 2020-11-08 DIAGNOSIS — Z8632 Personal history of gestational diabetes: Secondary | ICD-10-CM

## 2020-11-08 DIAGNOSIS — Z6835 Body mass index (BMI) 35.0-35.9, adult: Secondary | ICD-10-CM | POA: Diagnosis not present

## 2020-11-08 DIAGNOSIS — G8929 Other chronic pain: Secondary | ICD-10-CM

## 2020-11-08 DIAGNOSIS — N926 Irregular menstruation, unspecified: Secondary | ICD-10-CM | POA: Diagnosis not present

## 2020-11-08 DIAGNOSIS — F418 Other specified anxiety disorders: Secondary | ICD-10-CM

## 2020-11-08 MED ORDER — CYCLOBENZAPRINE HCL 5 MG PO TABS: 5.0000 mg | ORAL_TABLET | Freq: Three times a day (TID) | ORAL | 0 refills | Status: AC | PRN
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
## 2020-11-08 NOTE — Patient Instructions (Addendum)
[email or web address]">  Chronic Back Pain When back pain lasts longer than 3 months, it is called chronic back pain. The cause of your back pain may not be known. Some common causes include:  Wear and tear (degenerative disease) of the bones, ligaments, or disks in your back.  Inflammation and stiffness in your back (arthritis). People who have chronic back pain often go through certain periods in which the pain is more intense (flare-ups). Many people can learn to manage the pain with home care. Follow these instructions at home: Pay attention to any changes in your symptoms. Take these actions to help with your pain: Managing pain and stiffness  If directed, apply ice to the painful area. Your health care provider may recommend applying ice during the first 24-48 hours after a flare-up begins. To do this: ? Put ice in a plastic bag. ? Place a towel between your skin and the bag. ? Leave the ice on for 20 minutes, 2-3 times per day.  If directed, apply heat to the affected area as often as told by your health care provider. Use the heat source that your health care provider recommends, such as a moist heat pack or a heating pad. ? Place a towel between your skin and the heat source. ? Leave the heat on for 20-30 minutes. ? Remove the heat if your skin turns bright red. This is especially important if you are unable to feel pain, heat, or cold. You may have a greater risk of getting burned.  Try soaking in a warm tub.      Activity  Avoid bending and other activities that make the problem worse.  Maintain a proper position when standing or sitting: ? When standing, keep your upper back and neck straight, with your shoulders pulled back. Avoid slouching. ? When sitting, keep your back straight and relax your shoulders. Do not round your shoulders or pull them backward.  Do not sit or stand in one place for long periods of time.  Take brief periods of rest  throughout the day. This will reduce your pain. Resting in a lying or standing position is usually better than sitting to rest.  When you are resting for longer periods, mix in some mild activity or stretching between periods of rest. This will help to prevent stiffness and pain.  Get regular exercise. Ask your health care provider what activities are safe for you.  Do not lift anything that is heavier than 10 lb (4.5 kg), or the limit that you are told, until your health care provider says that it is safe. Always use proper lifting technique, which includes: ? Bending your knees. ? Keeping the load close to your body. ? Avoiding twisting.  Sleep on a firm mattress in a comfortable position. Try lying on your side with your knees slightly bent. If you lie on your back, put a pillow under your knees.   Medicines  Treatment may include medicines for pain and inflammation taken by mouth or applied to the skin, prescription pain medicine, or muscle relaxants. Take over-the-counter and prescription medicines only as told by your health care provider.  Ask your health care provider if the medicine prescribed to you: ? Requires you to avoid driving or using machinery. ? Can cause constipation. You may need to take these actions to prevent or treat constipation:  Drink enough fluid to keep your urine pale yellow.  Take over-the-counter or prescription medicines.  Eat foods that are high in fiber, such  as beans, whole grains, and fresh fruits and vegetables.  Limit foods that are high in fat and processed sugars, such as fried or sweet foods. General instructions  Do not use any products that contain nicotine or tobacco, such as cigarettes, e-cigarettes, and chewing tobacco. If you need help quitting, ask your health care provider.  Keep all follow-up visits as told by your health care provider. This is important. Contact a health care provider if:  You have pain that is not relieved with  rest or medicine.  Your pain gets worse, or you have new pain.  You have a high fever.  You have rapid weight loss.  You have trouble doing your normal activities. Get help right away if:  You have weakness or numbness in one or both of your legs or feet.  You have trouble controlling your bladder or your bowels.  You have severe back pain and have any of the following: ? Nausea or vomiting. ? Pain in your abdomen. ? Shortness of breath or you faint. Summary  Chronic back pain is back pain that lasts longer than 3 months.  When a flare-up begins, apply ice to the painful area for the first 24-48 hours.  Apply a moist heat pad or use a heating pad on the painful area as directed by your health care provider.  When you are resting for longer periods, mix in some mild activity or stretching between periods of rest. This will help to prevent stiffness and pain. This information is not intended to replace advice given to you by your health care provider. Make sure you discuss any questions you have with your health care provider. Document Revised: 10/15/2019 Document Reviewed: 10/15/2019 Elsevier Patient Education  2021 [employer].   Cyclobenzaprine tablets What is this medicine? CYCLOBENZAPRINE (sye kloe BEN za preen) is a muscle relaxer. It is used to treat muscle pain, spasms, and stiffness. This medicine may be used for other purposes; ask your health care provider or pharmacist if you have questions. COMMON BRAND NAME(S): Fexmid, Flexeril What should I tell my health care provider before I take this medicine? They need to know if you have any of these conditions:  heart disease, irregular heartbeat, or previous heart attack  liver disease  thyroid problem  an unusual or allergic reaction to cyclobenzaprine, tricyclic antidepressants, lactose, other medicines, foods, dyes, or preservatives  pregnant or trying to get pregnant  breast-feeding How should I use this  medicine? Take this medicine by mouth with a glass of water. Follow the directions on the prescription label. If this medicine upsets your stomach, take it with food or milk. Take your medicine at regular intervals. Do not take it more often than directed. Talk to your pediatrician regarding the use of this medicine in children. Special care may be needed. Overdosage: If you think you have taken too much of this medicine contact a poison control center or emergency room at once. NOTE: This medicine is only for you. Do not share this medicine with others. What if I miss a dose? If you miss a dose, take it as soon as you can. If it is almost time for your next dose, take only that dose. Do not take double or extra doses. What may interact with this medicine? Do not take this medicine with any of the following medications:  MAOIs like Carbex, Eldepryl, Marplan, Nardil, and Parnate  narcotic medicines for cough  safinamide This medicine may also interact with the following medications:  alcohol  bupropion  antihistamines for allergy, cough and cold  certain medicines for anxiety or sleep  certain medicines for bladder problems like oxybutynin, tolterodine  certain medicines for depression like amitriptyline, fluoxetine, sertraline  certain medicines for Parkinson's disease like benztropine, trihexyphenidyl  certain medicines for seizures like phenobarbital, primidone  certain medicines for stomach problems like dicyclomine, hyoscyamine  certain medicines for travel sickness like scopolamine  general anesthetics like halothane, isoflurane, methoxyflurane, propofol  ipratropium  local anesthetics like lidocaine, pramoxine, tetracaine  medicines that relax muscles for surgery  narcotic medicines for pain  phenothiazines like chlorpromazine, mesoridazine, prochlorperazine, thioridazine  verapamil This list may not describe all possible interactions. Give your health care  provider a list of all the medicines, herbs, non-prescription drugs, or dietary supplements you use. Also tell them if you smoke, drink alcohol, or use illegal drugs. Some items may interact with your medicine. What should I watch for while using this medicine? Tell your doctor or health care professional if your symptoms do not start to get better or if they get worse. You may get drowsy or dizzy. Do not drive, use machinery, or do anything that needs mental alertness until you know how this medicine affects you. Do not stand or sit up quickly, especially if you are an older patient. This reduces the risk of dizzy or fainting spells. Alcohol may interfere with the effect of this medicine. Avoid alcoholic drinks. If you are taking another medicine that also causes drowsiness, you may have more side effects. Give your health care provider a list of all medicines you use. Your doctor will tell you how much medicine to take. Do not take more medicine than directed. Call emergency for help if you have problems breathing or unusual sleepiness. Your mouth may get dry. Chewing sugarless gum or sucking hard candy, and drinking plenty of water may help. Contact your doctor if the problem does not go away or is severe. What side effects may I notice from receiving this medicine? Side effects that you should report to your doctor or health care professional as soon as possible:  allergic reactions like skin rash, itching or hives, swelling of the face, lips, or tongue  breathing problems  chest pain  fast, irregular heartbeat  hallucinations  seizures  unusually weak or tired Side effects that usually do not require medical attention (report to your doctor or health care professional if they continue or are bothersome):  headache  nausea, vomiting This list may not describe all possible side effects. Call your doctor for medical advice about side effects. You may report side effects to FDA at  1-800-FDA-1088. Where should I keep my medicine? Keep out of the reach of children. Store at room temperature between 15 and 30 degrees C (59 and 86 degrees F). Keep container tightly closed. Throw away any unused medicine after the expiration date. NOTE: This sheet is a summary. It may not cover all possible information. If you have questions about this medicine, talk to your doctor, pharmacist, or health care provider.  2021 Elsevier/Gold Standard (2018-08-07 12:49:26)   Flibanserin Oral Tablets What is this medicine? FLIBANSERIN (fly BAN ser in) is used to treat hypoactive (low) sexual desire disorder (HSDD) in women who have not gone through menopause, who have not had low sexual desire in the past, and who have low sexual desire no matter the type of sexual activity, the situation, or the sexual partner. Women with HSDD have a low sexual desire that is troubling  to them, and is not due to a medical or mental health problem, problems in the relationship, medicines, or drug abuse. This medicine is not for HSDD in women who have gone through menopause. This medicine is not for men. This medicine not for use to improve sexual performance. This medicine may be used for other purposes; ask your health care provider or pharmacist if you have questions. COMMON BRAND NAME(S): Addyi What should I tell my health care provider before I take this medicine? They need to know if you have any of these conditions:  dehydration  if you drink alcohol  drug abuse or addiction  heart disease  history of depression or other mental health problems  history of a drug or alcohol abuse problem  liver disease  low blood pressure  an unusual or allergic reaction to flibanserin, other medicines, foods, dyes, or preservatives  pregnant or trying to get pregnant  breast-feeding How should I use this medicine? Take this medicine by mouth with water. Take it as directed on the prescription label. This  medicine should only be taken at bedtime. Do not take this medicine with grapefruit juice. If you have 1 or 2 alcohol-containing drinks, wait at least 2 hours before taking this medicine at bedtime. Do not take your bedtime dose if you have consumed 3 or more alcohol-containing drinks. After taking this medicine at bedtime, do not drink alcohol until the next day. A special MedGuide will be given to you by the pharmacist with each prescription and refill. Be sure to read this information carefully each time. Talk to your health care provider about the use of this medicine in children. It is not approved for use in children. Overdosage: If you think you have taken too much of this medicine contact a poison control center or emergency room at once. NOTE: This medicine is only for you. Do not share this medicine with others. What if I miss a dose? If you miss your dose at bedtime, skip the missed dose and take the next dose at bedtime the next day. Do not take this medicine the next morning or double your next dose. What may interact with this medicine? Do not take this medicine with any of the following medications:  berotralstat  certain antivirals for HIV or hepatitis  certain medicines for fungal infections like fluconazole, ketoconazole, itraconazole, or posaconazole  ciprofloxacin  clarithromycin  conivaptan  diltiazem  erythromycin  grapefruit juice  nefazodone  telithromycin  verapamil This medicine may also interact with the following medications:  alcohol  birth control pills  bupropion  certain medicines for anxiety or sleep  certain medicines for seizures like carbamazepine, phenobarbital, phenytoin  certain medicines for stomach problems like cimetidine, esomeprazole, dexlansoprazole, lansoprazole, omeprazole, pantoprazole, rabeprazole, ranitidine  digoxin  diphenhydramine  etravirine  fluoxetine  fluvoxamine  ginkgo biloba  lorcaserin  narcotic  medicines for pain  resveratrol  rifabutin  rifampin  rifapentine  sirolimus  St. John's Wort This list may not describe all possible interactions. Give your health care provider a list of all the medicines, herbs, non-prescription drugs, or dietary supplements you use. Also tell them if you smoke, drink alcohol, or use illegal drugs. Some items may interact with your medicine. What should I watch for while using this medicine? Visit your doctor or health care provider for regular checks on your progress. Tell your health care provider if your symptoms do not start to get better after you have taken this medicine for 8 weeks. You  may get drowsy or dizzy. Do not drive, use machinery, or do anything that needs mental alertness for at least 6 hours after your dose and until you know how this medicine affects you. Do not stand up or sit up quickly. This reduces the risk of dizzy or fainting spells. Alcohol can increase dizziness and drowsiness, and can increase the risk of low blood pressure or fainting spells when combined with this medicine. Wait at least 2 hours after consuming 1 or 2 standard alcoholic drinks before taking this medicine at bedtime. Do not take this medicine at bedtime if you have consumed 3 or more standard alcoholic drinks that evening. What side effects may I notice from receiving this medicine? Side effects that you should report to your doctor or health care professional as soon as possible:  allergic reactions (skin rash, itching or hives; swelling of the face, lips, or tongue)  extreme drowsiness  low blood pressure (dizziness; feeling faint or lightheaded, falls; unusually weak or tired) Side effects that usually do not require medical attention (report to your doctor or health care professional if they continue or are bothersome):  dry mouth  nausea  tiredness  trouble sleeping This list may not describe all possible side effects. Call your doctor for  medical advice about side effects. You may report side effects to FDA at 1-800-FDA-1088. Where should I keep my medicine? Keep out of the reach of children and pets. Store at [employer] [address] (77 degrees F). Get rid of any unused medicine after the expiration date. To get rid of medicines that are no longer needed or have expired:  Take the medicine to a medicine take-back program. Check with your pharmacy or law enforcement to find a location.  If you cannot return the medicine, check the label or package insert to see if the medicine should be thrown out in the garbage or flushed down the toilet. If you are not sure, ask your health care provider. If it is safe to put it in the trash, the medicine out of the container. Mix the medicine with cat litter, dirt, coffee grounds, or other unwanted substance. Seal the mixture in a bag or container. Put it in the trash. NOTE: This sheet is a summary. It may not cover all possible information. If you have questions about this medicine, talk to your doctor, pharmacist, or health care provider.  2021 Elsevier/Gold Standard (2020-06-21 17:05:06)   Bremelanotide injection What is this medicine? BREMELANOTIDE (BRE mel AN oh tide) is used to treat hypoactive (low) sexual desire disorder (HSDD) in women who have not gone through menopause, who have not had low sexual desire in the past, and who have low sexual desire no matter the type of sexual activity, the situation, or the sexual partner. Women with HSDD have a low sexual desire that is troubling to them, and is not due to a medical or mental health problem, problems in the relationship, medicines, or drug abuse. This medicine is not for HSDD in women who have gone through menopause. This medicine is not for men. This medicine not for use to improve sexual performance. This medicine may be used for other purposes; ask your health care provider or pharmacist if you have questions. COMMON BRAND NAME(S):  VYLEESI What should I tell my health care provider before I take this medicine? They need to know if you have any of these conditions:  heart disease  high blood pressure  an unusual or allergic reaction to bremelanotide, other  medicines, foods, dyes, or preservatives  pregnant or trying to get pregnant  breast-feeding How should I use this medicine? This medicine is for injection under the skin. You will be taught how to prepare and give this medicine. Use exactly as directed. Do not take your medicine more often than directed. It is important that you put your used autoinjector in a special sharps container. Do not put them in a trash can. If you do not have a sharps container, call your pharmacist or healthcare provider to get one. A special MedGuide will be given to you by the pharmacist with each prescription and refill. Be sure to read this information carefully each time. Talk to your pediatrician regarding the use of this medicine in children. Special care may be needed. Overdosage: If you think you have taken too much of this medicine contact a poison control center or emergency room at once. NOTE: This medicine is only for you. Do not share this medicine with others. What if I miss a dose? This does not apply. This medicine is not for regular use. What may interact with this medicine?  naltrexone products for drug or alcohol addiction This list may not describe all possible interactions. Give your health care provider a list of all the medicines, herbs, non-prescription drugs, or dietary supplements you use. Also tell them if you smoke, drink alcohol, or use illegal drugs. Some items may interact with your medicine. What should I watch for while using this medicine? Visit your doctor or health care professional for regular checks on your progress. Tell your doctor if your symptoms have not improved after you have taken this medicine for 8 weeks. Do not become pregnant while  taking this medicine. Women should inform their healthcare professional if they wish to become pregnant or think they might be pregnant. There is a potential for serious side effects and harm to an unborn child. Talk to your healthcare professional for more information. What side effects may I notice from receiving this medicine? Side effects that you should report to your doctor or health care professional as soon as possible:  allergic reactions like skin rash, itching or hives, swelling of the face, lips, or tongue  high blood pressure  severe nausea that does not go away  skin discoloration, such as dark spots on the face, gums, or breast Side effects that usually do not require medical attention (report these to your doctor or health care professional if they continue or are bothersome):  cough  dizziness  flushing  headache  hot flashes  nausea, vomiting  pain, redness or irritation at site where injected  stuffy nose  tiredness This list may not describe all possible side effects. Call your doctor for medical advice about side effects. You may report side effects to FDA at 1-800-FDA-1088. Where should I keep my medicine? Keep out of the reach of children. Store unopened autoinjectors at or below 25 degrees C (77 degrees F). Do not freeze. Protect from light. Throw away any unused medicine after the expiration date. NOTE: This sheet is a summary. It may not cover all possible information. If you have questions about this medicine, talk to your doctor, pharmacist, or health care provider.  2021 Elsevier/Gold Standard (2018-03-12 14:08:51)   Preventive Care 61-78 Years Old, Female Preventive care refers to lifestyle choices and visits with your health care provider that can promote health and wellness. This includes:  A yearly physical exam. This is also called an annual wellness visit.  Regular dental and eye exams.  Immunizations.  Screening for certain  conditions.  Healthy lifestyle choices, such as: ? Eating a healthy diet. ? Getting regular exercise. ? Not using drugs or products that contain nicotine and tobacco. ? Limiting alcohol use. What can I expect for my preventive care visit? Physical exam Your health care provider may check your:  Height and weight. These may be used to calculate your BMI (body mass index). BMI is a measurement that tells if you are at a healthy weight.  Heart rate and blood pressure.  Body temperature.  Skin for abnormal spots. Counseling Your health care provider may ask you questions about your:  Past medical problems.  Family's medical history.  Alcohol, tobacco, and drug use.  Emotional well-being.  Home life and relationship well-being.  Sexual activity.  Diet, exercise, and sleep habits.  Work and work Statistician.  Access to firearms.  Method of birth control.  Menstrual cycle.  Pregnancy history. What immunizations do I need? Vaccines are usually given at various ages, according to a schedule. Your health care provider will recommend vaccines for you based on your age, medical history, and lifestyle or other factors, such as travel or where you work.   What tests do I need? Blood tests  Lipid and cholesterol levels. These may be checked every 5 years starting at age 46.  Hepatitis C test.  Hepatitis B test. Screening  Diabetes screening. This is done by checking your blood sugar (glucose) after you have not eaten for a while (fasting).  STD (sexually transmitted disease) testing, if you are at risk.  BRCA-related cancer screening. This may be done if you have a family history of breast, ovarian, tubal, or peritoneal cancers.  Pelvic exam and Pap test. This may be done every 3 years starting at age 37. Starting at age 29, this may be done every 5 years if you have a Pap test in combination with an HPV test. Talk with your health care provider about your test  results, treatment options, and if necessary, the need for more tests.   Follow these instructions at home: Eating and drinking  Eat a healthy diet that includes fresh fruits and vegetables, whole grains, lean protein, and low-fat dairy products.  Take vitamin and mineral supplements as recommended by your health care provider.  Do not drink alcohol if: ? Your health care provider tells you not to drink. ? You are pregnant, may be pregnant, or are planning to become pregnant.  If you drink alcohol: ? Limit how much you have to 0-1 drink a day. ? Be aware of how much alcohol is in your drink. In the [place] one drink equals one 12 oz bottle of beer (355 mL), one 5 oz glass of wine (148 mL), or one 1 oz glass of hard liquor (44 mL).   Lifestyle  Take daily care of your teeth and gums. Brush your teeth every morning and night with fluoride toothpaste. Floss one time each day.  Stay active. Exercise for at least 30 minutes 5 or more days each week.  Do not use any products that contain nicotine or tobacco, such as cigarettes, e-cigarettes, and chewing tobacco. If you need help quitting, ask your health care provider.  Do not use drugs.  If you are sexually active, practice safe sex. Use a condom or other form of protection to prevent STIs (sexually transmitted infections).  If you do not wish to become pregnant, use a form of birth control.  If you plan to become pregnant, see your health care provider for a prepregnancy visit.  Find healthy ways to cope with stress, such as: ? Meditation, yoga, or listening to music. ? Journaling. ? Talking to a trusted person. ? Spending time with friends and family. Safety  Always wear your seat belt while driving or riding in a vehicle.  Do not drive: ? If you have been drinking alcohol. Do not ride with someone who has been drinking. ? When you are tired or distracted. ? While texting.  Wear a helmet and other protective equipment during  sports activities.  If you have firearms in your house, make sure you follow all gun safety procedures.  Seek help if you have been physically or sexually abused. What's next?  Go to your health care provider once a year for an annual wellness visit.  Ask your health care provider how often you should have your eyes and teeth checked.  Stay up to date on all vaccines. This information is not intended to replace advice given to you by your health care provider. Make sure you discuss any questions you have with your health care provider. Document Revised: 05/02/2020 Document Reviewed: 05/16/2018 Elsevier Patient Education  2021 [place] Disorder, Adult Social anxiety disorder (SAD), previously called social phobia, is a mental health condition. People with SAD often feel nervous, afraid, or embarrassed when they are around other people in social situations. They worry that other people are judging or criticizing them for how they look, what they say, or how they act. SAD involves more than just feeling shy or self-conscious at times. It can cause severe emotional distress. It can interfere with activities of daily life. SAD also may lead to alcohol or drug use, and even suicide. SAD is a common mental health condition. It can develop at any time, but it usually starts in the teenage years. What are the causes? The cause of this condition is not known. It may involve genes that are passed through families. Stressful events may trigger anxiety. This disorder is also associated with an overactive amygdala. The amygdala is the part of the brain that triggers your response to strong feelings, such as fear. What increases the risk? This condition is more likely to develop in:  People who have a family history of anxiety disorders.  Women.  People who have a physical or behavioral condition that makes them feel self-conscious or nervous, such as a stutter or a long-term  (chronic) disease. What are the signs or symptoms? The main symptom of this condition is fear of embarrassment caused by being criticized or judged in social situations. You may be afraid to:  Speak in public.  Go shopping.  Use a public bathroom.  Eat at a restaurant.  Go to work.  Interact with people you do not know. Extreme fear and anxiety may cause physical symptoms, including:  Blushing.  A fast heartbeat.  Sweating.  Shaky hands or voice.  Confusion.  Light-headedness.  Upset stomach, diarrhea, or vomiting.  Shortness of breath. How is this diagnosed? This condition is diagnosed based on your history, symptoms, and behavior in social situations. You may be diagnosed with this type of anxiety if your symptoms have lasted for more than 6 months and have been present on more days than not. Your health care provider may ask you about your use of alcohol, drugs, and prescription medicines. He or she may also refer you to a mental health specialist  for further evaluation or treatment. How is this treated? Treatment for this condition may include:  Cognitive behavioral therapy (CBT). This type of talk therapy helps you learn to replace negative thoughts and behaviors with positive ones. This may include learning how to use self-calming skills and other methods of managing your anxiety.  Exposure therapy. You will be exposed to social situations that cause you fear. The treatment starts with practicing self-calming in situations that cause you low levels of fear. Over time, you will progress by sustaining self-calming and managing harder situations.  Antidepressant medicines. These medicines may be used by themselves or in addition to other therapies.  Biofeedback. This process trains you to manage your body's response (physiological response) through breathing techniques and relaxation methods. You will work with a therapist while machines are used to monitor your  physical symptoms.  Techniques for relaxation and managing anxiety. These include deep breathing, self-talk, meditation, visual imagery, muscle relaxation, music therapy, and yoga. These techniques are often used with other therapies to keep you calm in situations that cause you anxiety. These treatments are often used in combination.   Follow these instructions at home: Alcohol use If you drink alcohol:  Limit how much you use to: ? 0-1 drink a day for nonpregnant women. ? 0-2 drinks a day for men.  Be aware of how much alcohol is in your drink. In the [place] one drink equals one 12 oz bottle of beer (355 mL), one 5 oz glass of wine (148 mL), or one 1 oz glass of hard liquor (44 mL). General instructions  Take over-the-counter and prescription medicines only as told by your health care provider.  Practice techniques for relaxation and managing anxiety at times you are not challenged by social anxiety.  Return to social activities using techniques you have learned, as you feel ready to do so.  Avoid caffeine and certain over-the-counter cold medicines. These may make you feel worse. Ask your pharmacists which medicines to avoid.  Keep all follow-up visits as told by your health care provider. This is important. Where to find more information  [employer] on Mental Illness (NAMI): https://www.nami.org  Social Anxiety Association: https://socialphobia.[facility]): PipeCollect[employer].no  Anxiety and Depression Association of [place] (ADAA): FeeTelevision.cz Contact a health care provider if:  Your symptoms do not improve or get worse.  You have signs of depression, such as: ? Persistent sadness or moodiness. ? Loss of enjoyment in activities that used to bring you joy. ? Change in weight or eating. ? Changes in sleeping habits. ? Avoiding friends or family members more than usual. ? Loss of energy for normal tasks. ? Feeling guilty or  worthless.  You become more isolated than you normally are.  You find it more and more difficult to speak or interact with others.  You are using drugs.  You are drinking more alcohol than usual. Get help right away if:  You harm yourself.  You have suicidal thoughts. If you ever feel like you may hurt yourself or others, or have thoughts about taking your own life, get help right away. You can go to your nearest emergency department or call:  Your local emergency services (911 in the [place]  A suicide crisis helpline, such as the [facility] at 903-836-2013. This is open 24 hours a day. Summary  Social anxiety disorder (SAD) may cause you to feel nervous, afraid, or embarrassed when you are around other people in social situations.  SAD  is a common mental disorder. It can develop at any time, but it usually starts in the teenage years.  Treatment includes talk therapy, exposure therapy, medicines, biofeedback, and relaxation techniques. It can involve a combination of treatments. This information is not intended to replace advice given to you by your health care provider. Make sure you discuss any questions you have with your health care provider. Document Revised: 02/05/2019 Document Reviewed: 02/05/2019 Elsevier Patient Education  [place].

## 2020-11-09 ENCOUNTER — Encounter (INDEPENDENT_AMBULATORY_CARE_PROVIDER_SITE_OTHER): Payer: Self-pay

## 2020-11-10 LAB — HEMOGLOBIN A1C
Est. average glucose Bld gHb Est-mCnc: 108 mg/dL
Hgb A1c MFr Bld: 5.4 % (ref 4.8–5.6)

## 2020-11-10 LAB — ESTRADIOL: Estradiol: 52.1 pg/mL

## 2020-11-10 LAB — TSH+T4F+T3FREE
Free T4: 1.38 ng/dL (ref 0.82–1.77)
T3, Free: 2.9 pg/mL (ref 2.0–4.4)
TSH: 3.16 u[IU]/mL (ref 0.450–4.500)

## 2020-11-10 LAB — TESTOSTERONE, FREE, TOTAL, SHBG
Sex Hormone Binding: 54.4 nmol/L (ref 24.6–122.0)
Testosterone, Free: 2.1 pg/mL (ref 0.0–4.2)
Testosterone: 39 ng/dL (ref 8–60)

## 2020-11-10 LAB — FSH/LH
FSH: 7.6 m[IU]/mL
LH: 10.5 m[IU]/mL

## 2020-11-10 LAB — PROGESTERONE: Progesterone: <0.1 ng/mL

## 2020-11-16 DIAGNOSIS — Z419 Encounter for procedure for purposes other than remedying health state, unspecified: Secondary | ICD-10-CM | POA: Diagnosis not present

## 2020-11-18 ENCOUNTER — Other Ambulatory Visit: Payer: Self-pay

## 2020-11-18 ENCOUNTER — Ambulatory Visit: Payer: Medicaid Other | Attending: Certified Nurse Midwife | Admitting: Physical Therapy

## 2020-11-18 ENCOUNTER — Encounter (INDEPENDENT_AMBULATORY_CARE_PROVIDER_SITE_OTHER): Payer: Self-pay | Admitting: Bariatrics

## 2020-11-18 ENCOUNTER — Encounter: Payer: Self-pay | Admitting: Physical Therapy

## 2020-11-18 ENCOUNTER — Ambulatory Visit (INDEPENDENT_AMBULATORY_CARE_PROVIDER_SITE_OTHER): Payer: Medicaid Other | Admitting: Bariatrics

## 2020-11-18 VITALS — BP 117/79 | HR 94 | Temp 98.0°F | Ht 63.0 in | Wt 195.0 lb

## 2020-11-18 DIAGNOSIS — M6281 Muscle weakness (generalized): Secondary | ICD-10-CM | POA: Insufficient documentation

## 2020-11-18 DIAGNOSIS — R0602 Shortness of breath: Secondary | ICD-10-CM

## 2020-11-18 DIAGNOSIS — R5383 Other fatigue: Secondary | ICD-10-CM | POA: Diagnosis not present

## 2020-11-18 DIAGNOSIS — M5441 Lumbago with sciatica, right side: Secondary | ICD-10-CM | POA: Diagnosis not present

## 2020-11-18 DIAGNOSIS — M5442 Lumbago with sciatica, left side: Secondary | ICD-10-CM | POA: Diagnosis not present

## 2020-11-18 DIAGNOSIS — Z1331 Encounter for screening for depression: Secondary | ICD-10-CM | POA: Diagnosis not present

## 2020-11-18 DIAGNOSIS — M545 Low back pain, unspecified: Secondary | ICD-10-CM | POA: Diagnosis not present

## 2020-11-18 DIAGNOSIS — O24410 Gestational diabetes mellitus in pregnancy, diet controlled: Secondary | ICD-10-CM | POA: Diagnosis not present

## 2020-11-18 DIAGNOSIS — G8929 Other chronic pain: Secondary | ICD-10-CM | POA: Insufficient documentation

## 2020-11-18 DIAGNOSIS — Z6834 Body mass index (BMI) 34.0-34.9, adult: Secondary | ICD-10-CM

## 2020-11-18 DIAGNOSIS — R293 Abnormal posture: Secondary | ICD-10-CM | POA: Diagnosis not present

## 2020-11-18 DIAGNOSIS — E559 Vitamin D deficiency, unspecified: Secondary | ICD-10-CM | POA: Diagnosis not present

## 2020-11-18 DIAGNOSIS — E6609 Other obesity due to excess calories: Secondary | ICD-10-CM

## 2020-11-18 DIAGNOSIS — Z0289 Encounter for other administrative examinations: Secondary | ICD-10-CM
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
## 2020-11-19 LAB — COMPREHENSIVE METABOLIC PANEL
ALT: 21 IU/L (ref 0–32)
AST: 14 IU/L (ref 0–40)
Albumin/Globulin Ratio: 1.7 (ref 1.2–2.2)
Albumin: 4.3 g/dL (ref 3.8–4.8)
Alkaline Phosphatase: 73 IU/L (ref 44–121)
BUN/Creatinine Ratio: 21 (ref 9–23)
BUN: 15 mg/dL (ref 6–20)
Bilirubin Total: 0.4 mg/dL (ref 0.0–1.2)
CO2: 20 mmol/L (ref 20–29)
Calcium: 9.2 mg/dL (ref 8.7–10.2)
Chloride: 103 mmol/L (ref 96–106)
Creatinine, Ser: 0.7 mg/dL (ref 0.57–1.00)
Globulin, Total: 2.6 g/dL (ref 1.5–4.5)
Glucose: 93 mg/dL (ref 65–99)
Potassium: 4 mmol/L (ref 3.5–5.2)
Sodium: 139 mmol/L (ref 134–144)
Total Protein: 6.9 g/dL (ref 6.0–8.5)
eGFR: 114 mL/min/{1.73_m2} (ref >59)

## 2020-11-19 LAB — INSULIN, RANDOM: INSULIN: 10.5 u[IU]/mL (ref 2.6–24.9)

## 2020-11-19 LAB — LIPID PANEL WITH LDL/HDL RATIO
Cholesterol, Total: 199 mg/dL (ref 100–199)
HDL: 52 mg/dL (ref >39)
LDL Chol Calc (NIH): 125 mg/dL — ABNORMAL HIGH (ref 0–99)
LDL/HDL Ratio: 2.4 ratio (ref 0.0–3.2)
Triglycerides: 122 mg/dL (ref 0–149)
VLDL Cholesterol Cal: 22 mg/dL (ref 5–40)

## 2020-11-19 LAB — VITAMIN D 25 HYDROXY (VIT D DEFICIENCY, FRACTURES): Vit D, 25-Hydroxy: 16.6 ng/mL — ABNORMAL LOW (ref 30.0–100.0)

## 2020-11-22 ENCOUNTER — Encounter (INDEPENDENT_AMBULATORY_CARE_PROVIDER_SITE_OTHER): Payer: Self-pay | Admitting: Bariatrics

## 2020-11-22 ENCOUNTER — Ambulatory Visit: Payer: Medicaid Other | Admitting: Physical Therapy

## 2020-11-22 ENCOUNTER — Other Ambulatory Visit: Payer: Self-pay

## 2020-11-22 ENCOUNTER — Encounter: Payer: Self-pay | Admitting: Physical Therapy

## 2020-11-22 DIAGNOSIS — E559 Vitamin D deficiency, unspecified: Secondary | ICD-10-CM | POA: Insufficient documentation

## 2020-11-22 DIAGNOSIS — M545 Low back pain, unspecified: Secondary | ICD-10-CM | POA: Diagnosis not present

## 2020-11-22 DIAGNOSIS — M6281 Muscle weakness (generalized): Secondary | ICD-10-CM | POA: Diagnosis not present

## 2020-11-22 DIAGNOSIS — R293 Abnormal posture: Secondary | ICD-10-CM

## 2020-11-22 DIAGNOSIS — G8929 Other chronic pain: Secondary | ICD-10-CM | POA: Diagnosis not present

## 2020-11-22 NOTE — Addendum Note (Signed)
Addended by: Kathryne Eriksson on: 11/22/2020 09:26 AM   Modules accepted: Orders

## 2020-11-22 NOTE — Progress Notes (Signed)
Chief Complaint:   OBESITY Sheila Mendez (MR# [id number]) is a 35 y.o. female who presents for evaluation and treatment of obesity and related comorbidities. Current BMI is Body mass index is 34.54 kg/m. Sheila Mendez has been struggling with her weight for many years and has been unsuccessful in either losing weight, maintaining weight loss, or reaching her healthy weight goal.  [name] states that she likes to cook. She craves chicken and sushi.  Sheila Mendez is currently in the action stage of change and ready to dedicate time achieving and maintaining a healthier weight. Sheila Mendez is interested in becoming our patient and working on intensive lifestyle modifications including (but not limited to) diet and exercise for weight loss.  Sheila Mendez's habits were reviewed today and are as follows: Her family eats meals together, her desired weight loss is 70-90 lbs, she has been heavy most of her life, she started gaining weight before her first pregnancy, her heaviest weight ever was 198 pounds, she has significant food cravings issues, she snacks frequently in the evenings, she skips meals frequently, she is frequently drinking liquids with calories, she frequently makes poor food choices, she has problems with excessive hunger and she struggles with emotional eating.  Depression Screen Sheila Mendez's Food and Mood (modified PHQ-9) score was 17.  Depression screen PHQ 2/9 11/18/2020  Decreased Interest 3  Down, Depressed, Hopeless 3  PHQ - 2 Score 6  Altered sleeping 1  Tired, decreased energy 2  Change in appetite 2  Feeling bad or failure about yourself  2  Trouble concentrating 2  Moving slowly or fidgety/restless 0  Suicidal thoughts 2  PHQ-9 Score 17  Difficult doing work/chores Very difficult   Subjective:   1. Other fatigue Sheila Mendez admits to daytime somnolence and admits to waking up still tired. Patent has a history of symptoms of daytime fatigue and morning headache. Sheila Mendez generally  gets 6 hours of sleep per night, and states that she has generally restful sleep. Snoring is present. Apneic episodes are not present. Epworth Sleepiness Score is 9.  2. SOB (shortness of breath) on exertion Sheila Mendez notes increasing shortness of breath with exercising and seems to be worsening over time with weight gain. She notes getting out of breath sooner with activity than she used to. This has not gotten worse recently. Sheila Mendez denies shortness of breath at rest or orthopnea.  3. Diet controlled gestational diabetes mellitus (GDM) in second trimester Sheila Mendez is not on medications, and she is in her second trimester.  4. Chronic bilateral low back pain with bilateral sciatica Sheila Mendez notes bilateral lower back pain.  Assessment/Plan:   1. Other fatigue Sheila Mendez does feel that her weight is causing her energy to be lower than it should be. Fatigue may be related to obesity, depression or many other causes. Labs will be ordered, and in the meanwhile, Sheila Mendez will focus on self care including making healthy food choices, increasing physical activity and focusing on stress reduction.  - EKG 12-Lead - Lipid Panel With LDL/HDL Ratio - VITAMIN D 25 Hydroxy (Vit-D Deficiency, Fractures)  2. SOB (shortness of breath) on exertion Sheila Mendez does feel that she gets out of breath more easily that she used to when she exercises. Sheila Mendez's shortness of breath appears to be obesity related and exercise induced. She has agreed to work on weight loss and gradually increase exercise to treat her exercise induced shortness of breath. Will continue to monitor closely.  3. Diet controlled gestational diabetes mellitus (GDM) in second trimester Good  blood sugar control is important to decrease the likelihood of diabetic complications such as nephropathy, neuropathy, limb loss, blindness, coronary artery disease, and death. Intensive lifestyle modification including diet, exercise and weight loss are the  first line of treatment for diabetes. Sheila Mendez is to decrease carbohydrates, increase healthy protein and fats. We will check labs today.  - Comprehensive metabolic panel - Insulin, random  4. Chronic bilateral low back pain with bilateral sciatica Sheila Mendez is to not do any pounding exercises, and she will continue to follow up as directed.  5. Depression screen Sheila Mendez had a positive depression screening. Depression is commonly associated with obesity and often results in emotional eating behaviors. We will monitor this closely and work on CBT to help improve the non-hunger eating patterns. Referral to Psychology may be required if no improvement is seen as she continues in our clinic.  6. Class 1 obesity due to excess calories without serious comorbidity with body mass index (BMI) of 34.0 to 34.9 in adult [name] is currently in the action stage of change and her goal is to continue with weight loss efforts. I recommend [name] begin the structured treatment plan as follows:  Sheila Mendez is to decrease her portion sizes. I discussed labs from 11/08/2020 with the patient today.  She has agreed to the Category 2 Plan.  Exercise goals: No exercise has been prescribed at this time.   Behavioral modification strategies: increasing lean protein intake, decreasing simple carbohydrates, increasing vegetables, increasing water intake, decreasing eating out, no skipping meals, meal planning and cooking strategies, keeping healthy foods in the home and planning for success.  She was informed of the importance of frequent follow-up visits to maximize her success with intensive lifestyle modifications for her multiple health conditions. She was informed we would discuss her lab results at her next visit unless there is a critical issue that needs to be addressed sooner. Sheila Mendez agreed to keep her next visit at the agreed upon time to discuss these results.  Objective:   Blood pressure 117/79, pulse  94, temperature 98 F (36.7 C), height 5\' 3"  (1.6 m), weight 195 lb (88.5 kg), last menstrual period 10/28/2020, SpO2 98 %. Body mass index is 34.54 kg/m.  EKG: Normal sinus rhythm, rate 89 BPM.  Indirect Calorimeter completed today shows a VO2 of 243 and a REE of 1694.  Her calculated basal metabolic rate is 12/26/2020 thus her basal metabolic rate is better than expected.  General: Cooperative, alert, well developed, in no acute distress. HEENT: Conjunctivae and lids unremarkable. Cardiovascular: Regular rhythm.  Lungs: Normal work of breathing. Neurologic: No focal deficits.   Lab Results  Component Value Date   CREATININE 0.70 11/18/2020   BUN 15 11/18/2020   NA 139 11/18/2020   K 4.0 11/18/2020   CL 103 11/18/2020   CO2 20 11/18/2020   Lab Results  Component Value Date   ALT 21 11/18/2020   AST 14 11/18/2020   ALKPHOS 73 11/18/2020   BILITOT 0.4 11/18/2020   Lab Results  Component Value Date   HGBA1C 5.4 11/08/2020   HGBA1C 5.2 03/11/2018   Lab Results  Component Value Date   INSULIN 10.5 11/18/2020   Lab Results  Component Value Date   TSH 3.160 11/08/2020   Lab Results  Component Value Date   CHOL 199 11/18/2020   HDL 52 11/18/2020   LDLCALC 125 (H) 11/18/2020   TRIG 122 11/18/2020   Lab Results  Component Value Date   WBC 13.3 (H) 09/14/2018  HGB 8.1 (L) 09/14/2018   HCT 26.0 (L) 09/14/2018   MCV 80.2 09/14/2018   PLT 224 09/14/2018   No results found for: IRON, TIBC, FERRITIN  Attestation Statements:   Reviewed by clinician on day of visit: allergies, medications, problem list, medical history, surgical history, family history, social history, and previous encounter notes.  Time spent on visit including pre-visit chart review and post-visit charting and care was 60 minutes.    Trude Mcburney, am acting as Energy manager for [employer], DO.  I have reviewed the above documentation for accuracy and completeness, and I agree with the above. Corinna Capra, DO
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
## 2020-11-23 ENCOUNTER — Encounter (INDEPENDENT_AMBULATORY_CARE_PROVIDER_SITE_OTHER): Payer: Self-pay | Admitting: Bariatrics

## 2020-11-29 ENCOUNTER — Other Ambulatory Visit: Payer: Self-pay

## 2020-11-29 ENCOUNTER — Ambulatory Visit: Payer: Medicaid Other | Admitting: Physical Therapy

## 2020-11-29 ENCOUNTER — Encounter: Payer: Self-pay | Admitting: Physical Therapy

## 2020-11-29 DIAGNOSIS — M6281 Muscle weakness (generalized): Secondary | ICD-10-CM | POA: Diagnosis not present

## 2020-11-29 DIAGNOSIS — G8929 Other chronic pain: Secondary | ICD-10-CM | POA: Diagnosis not present

## 2020-11-29 DIAGNOSIS — M545 Low back pain, unspecified: Secondary | ICD-10-CM | POA: Diagnosis not present

## 2020-11-29 DIAGNOSIS — R293 Abnormal posture: Secondary | ICD-10-CM | POA: Diagnosis not present
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
## 2020-12-02 ENCOUNTER — Ambulatory Visit (INDEPENDENT_AMBULATORY_CARE_PROVIDER_SITE_OTHER): Payer: Medicaid Other | Admitting: Bariatrics

## 2020-12-02 ENCOUNTER — Encounter (INDEPENDENT_AMBULATORY_CARE_PROVIDER_SITE_OTHER): Payer: Self-pay | Admitting: Bariatrics

## 2020-12-02 ENCOUNTER — Other Ambulatory Visit: Payer: Self-pay

## 2020-12-02 VITALS — BP 117/78 | HR 100 | Temp 98.4°F | Ht 63.0 in | Wt 193.0 lb

## 2020-12-02 DIAGNOSIS — E559 Vitamin D deficiency, unspecified: Secondary | ICD-10-CM

## 2020-12-02 DIAGNOSIS — E6609 Other obesity due to excess calories: Secondary | ICD-10-CM | POA: Diagnosis not present

## 2020-12-02 DIAGNOSIS — E8881 Metabolic syndrome: Secondary | ICD-10-CM

## 2020-12-02 DIAGNOSIS — Z6832 Body mass index (BMI) 32.0-32.9, adult: Secondary | ICD-10-CM | POA: Diagnosis not present

## 2020-12-02 MED ORDER — VITAMIN D (ERGOCALCIFEROL) 1.25 MG (50000 UNIT) PO CAPS: 50000.0000 [IU] | ORAL_CAPSULE | ORAL | 0 refills | Status: AC

## 2020-12-02 MED ORDER — METFORMIN HCL 500 MG PO TABS: 500.0000 mg | ORAL_TABLET | Freq: Every day | ORAL | 0 refills | Status: AC

## 2020-12-06 ENCOUNTER — Ambulatory Visit: Payer: Medicaid Other | Admitting: Physical Therapy

## 2020-12-06 NOTE — Progress Notes (Signed)
Chief Complaint:   OBESITY Sheila Mendez is here to discuss her progress with her obesity treatment plan along with follow-up of her obesity related diagnoses. Sheila Mendez is on the Category 2 Plan and states she is following her eating plan approximately 100% of the time. Sheila Mendez states she is doing 0 minutes 0 times per week.  Today's visit was #: 2 Starting weight: 195 lbs Starting date: 11/18/2020 Today's weight: 193 lbs Today's date: 12/02/2020 Total lbs lost to date: 2 lbs Total lbs lost since last in-office visit: 2 lbs  Interim History: Sheila Mendez is down 2 lbs. She followed the plan closely and had no additional carbohydrates. She has about 1 1/2 lbs of fluid, per the bio-impedence.  Subjective:   1. Vitamin D deficiency Sheila Mendez's Vitamin D level was 16.6 on 11/18/2020. She is currently taking no vitamin D supplement.   Ref. Range 11/18/2020 10:26  Vitamin D, 25-Hydroxy Latest Ref Range: 30.0 - 100.0 ng/mL 16.6 (L)   2. Insulin resistance Sheila Mendez has a history of gestational diabetes. Her A1c is 5.4 and insulin level 10.5.  Lab Results  Component Value Date   INSULIN 10.5 11/18/2020   Lab Results  Component Value Date   HGBA1C 5.4 11/08/2020    Assessment/Plan:   1. Vitamin D deficiency Low Vitamin D level contributes to fatigue and are associated with obesity, breast, and colon cancer. She agrees to start to take prescription Vitamin D @50 ,000 IU every week and will follow-up for routine testing of Vitamin D, at least 2-3 times per year to avoid over-replacement.  - Vitamin D, Ergocalciferol, (DRISDOL) 1.25 MG (50000 UNIT) CAPS capsule; Take 1 capsule (50,000 Units total) by mouth every 7 (seven) days.  Dispense: 4 capsule; Refill: 0  2. Insulin resistance Sheila Mendez will continue to work on weight loss, exercise, and decreasing simple carbohydrates to help decrease the risk of diabetes. Sheila Mendez agreed to follow-up with Sheila Mendez as directed to closely monitor her progress.  -  metFORMIN (GLUCOPHAGE) 500 MG tablet; Take 1 tablet (500 mg total) by mouth daily with lunch.  Dispense: 30 tablet; Refill: 0  3. Class 1 obesity due to excess calories without serious comorbidity with body mass index (BMI) of 32.0 to 32.9 in adult Sheila Mendez is currently in the action stage of change. As such, her goal is to continue with weight loss efforts. She has agreed to the Category 2 Plan.   Reviewed 11/18/2020 labs with pt.  Increase protein and weigh meats. Sheila Mendez will not skip meals   Exercise goals: As is  Behavioral modification strategies: increasing lean protein intake, decreasing simple carbohydrates, increasing vegetables, increasing water intake, decreasing eating out, no skipping meals, meal planning and cooking strategies, keeping healthy foods in the home and planning for success.  Sheila Mendez has agreed to follow-up with our clinic in 2 weeks. She was informed of the importance of frequent follow-up visits to maximize her success with intensive lifestyle modifications for her multiple health conditions.   Objective:   Blood pressure 117/78, pulse 100, temperature 98.4 F (36.9 C), height 5\' 3"  (1.6 m), weight 193 lb (87.5 kg), SpO2 97 %. Body mass index is 34.19 kg/m.  General: Cooperative, alert, well developed, in no acute distress. HEENT: Conjunctivae and lids unremarkable. Cardiovascular: Regular rhythm.  Lungs: Normal work of breathing. Neurologic: No focal deficits.   Lab Results  Component Value Date   CREATININE 0.70 11/18/2020   BUN 15 11/18/2020   NA 139 11/18/2020   K 4.0 11/18/2020  CL 103 11/18/2020   CO2 20 11/18/2020   Lab Results  Component Value Date   ALT 21 11/18/2020   AST 14 11/18/2020   ALKPHOS 73 11/18/2020   BILITOT 0.4 11/18/2020   Lab Results  Component Value Date   HGBA1C 5.4 11/08/2020   HGBA1C 5.2 03/11/2018   Lab Results  Component Value Date   INSULIN 10.5 11/18/2020   Lab Results  Component Value Date   TSH 3.160  11/08/2020   Lab Results  Component Value Date   CHOL 199 11/18/2020   HDL 52 11/18/2020   LDLCALC 125 (H) 11/18/2020   TRIG 122 11/18/2020   Lab Results  Component Value Date   WBC 13.3 (H) 09/14/2018   HGB 8.1 (L) 09/14/2018   HCT 26.0 (L) 09/14/2018   MCV 80.2 09/14/2018   PLT 224 09/14/2018    Attestation Statements:   Reviewed by clinician on day of visit: allergies, medications, problem list, medical history, surgical history, family history, social history, and previous encounter notes.  Edmund Hilda, am acting as Energy manager for [employer], DO.  I have reviewed the above documentation for accuracy and completeness, and I agree with the above. Corinna Capra, DO

## 2020-12-13 ENCOUNTER — Other Ambulatory Visit: Payer: Self-pay

## 2020-12-13 ENCOUNTER — Ambulatory Visit: Payer: Medicaid Other | Admitting: Physical Therapy

## 2020-12-13 ENCOUNTER — Encounter: Payer: Self-pay | Admitting: Physical Therapy

## 2020-12-13 DIAGNOSIS — R293 Abnormal posture: Secondary | ICD-10-CM

## 2020-12-13 DIAGNOSIS — G8929 Other chronic pain: Secondary | ICD-10-CM | POA: Diagnosis not present

## 2020-12-13 DIAGNOSIS — M6281 Muscle weakness (generalized): Secondary | ICD-10-CM

## 2020-12-13 DIAGNOSIS — M545 Low back pain, unspecified: Secondary | ICD-10-CM | POA: Diagnosis not present
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
## 2020-12-16 ENCOUNTER — Encounter (INDEPENDENT_AMBULATORY_CARE_PROVIDER_SITE_OTHER): Payer: Self-pay | Admitting: Bariatrics

## 2020-12-16 ENCOUNTER — Telehealth (INDEPENDENT_AMBULATORY_CARE_PROVIDER_SITE_OTHER): Payer: Medicaid Other | Admitting: Bariatrics

## 2020-12-16 DIAGNOSIS — F5089 Other specified eating disorder: Secondary | ICD-10-CM

## 2020-12-16 DIAGNOSIS — Z6834 Body mass index (BMI) 34.0-34.9, adult: Secondary | ICD-10-CM | POA: Diagnosis not present

## 2020-12-16 DIAGNOSIS — E6609 Other obesity due to excess calories: Secondary | ICD-10-CM | POA: Diagnosis not present

## 2020-12-16 DIAGNOSIS — E559 Vitamin D deficiency, unspecified: Secondary | ICD-10-CM

## 2020-12-16 DIAGNOSIS — E8881 Metabolic syndrome: Secondary | ICD-10-CM | POA: Diagnosis not present

## 2020-12-16 MED ORDER — BUPROPION HCL ER (SR) 150 MG PO TB12: 150.0000 mg | ORAL_TABLET | Freq: Every day | ORAL | 0 refills | Status: AC

## 2020-12-17 DIAGNOSIS — Z419 Encounter for procedure for purposes other than remedying health state, unspecified: Secondary | ICD-10-CM | POA: Diagnosis not present

## 2020-12-20 ENCOUNTER — Encounter: Payer: Medicaid Other | Admitting: Physical Therapy

## 2020-12-21 ENCOUNTER — Encounter (INDEPENDENT_AMBULATORY_CARE_PROVIDER_SITE_OTHER): Payer: Self-pay | Admitting: Bariatrics

## 2020-12-21 NOTE — Progress Notes (Signed)
TeleHealth Visit:  Due to the COVID-19 pandemic, this visit was completed with telemedicine (audio/video) technology to reduce patient and provider exposure as well as to preserve personal protective equipment.   Sheila Mendez has verbally consented to this TeleHealth visit. The patient is located at home, the provider is located at the [facility] office. The participants in this visit include the listed provider and patient. The visit was conducted today via video.   Chief Complaint: OBESITY Sheila Mendez is here to discuss her progress with her obesity treatment plan along with follow-up of her obesity related diagnoses. Sheila Mendez is on the Category 2 Plan and states she is following her eating plan approximately 100% of the time. Sheila Mendez states she is walking 2 hours 3 times per week.  Today's visit was #: 3 Starting weight: 195 lbs Starting date: 11/18/2020  Interim History: Sheila Mendez has lost about 1 lb. She has been staying with the plan and has increased her meat and vegetables. She is drinking more water.  Subjective:   1. Insulin resistance Sheila Mendez is taking Metformin and reports no problems.  Lab Results  Component Value Date   INSULIN 10.5 11/18/2020   Lab Results  Component Value Date   HGBA1C 5.4 11/08/2020    2. Vitamin D deficiency Sheila Mendez is taking prescription Vit D supplementation.   Ref. Range 11/18/2020 10:26  Vitamin D, 25-Hydroxy Latest Ref Range: 30.0 - 100.0 ng/mL 16.6 (L)    3. Other disorder of eating Sheila Mendez reports she is stress eating.  Assessment/Plan:   1. Insulin resistance Sheila Mendez will continue to work on weight loss, exercise, and decreasing simple carbohydrates to help decrease the risk of diabetes. Sheila Mendez agreed to follow-up with [place] as directed to closely monitor her progress. Continue Metformin.  2. Vitamin D deficiency Low Vitamin D level contributes to fatigue and are associated with obesity, breast, and colon cancer.  She agrees to continue to take prescription Vitamin D @50 ,000 IU every week and will follow-up for routine testing of Vitamin D, at least 2-3 times per year to avoid over-replacement.  3. Other disorder of eating Behavior modification techniques were discussed today to help [place] deal with her emotional/non-hunger eating behaviors.  Orders and follow up as documented in patient record.   - buPROPion (WELLBUTRIN SR) 150 MG 12 hr tablet; Take 1 tablet (150 mg total) by mouth daily.  Dispense: 30 tablet; Refill: 0  4. Class 1 obesity due to excess calories without serious comorbidity with body mass index (BMI) of 34.0 to 34.9 in adult Sheila Mendez is currently in the action stage of change. As such, her goal is to continue with weight loss efforts. She has agreed to the Category 2 Plan.   Exercise goals: Continue walking 2 hours 3 times a week and PT.  Behavioral modification strategies: increasing lean protein intake, decreasing simple carbohydrates, increasing vegetables, increasing water intake, decreasing eating out, no skipping meals, meal planning and cooking strategies, keeping healthy foods in the home and planning for success.  Sheila Mendez has agreed to follow-up with our clinic in 2 weeks. She was informed of the importance of frequent follow-up visits to maximize her success with intensive lifestyle modifications for her multiple health conditions.  Objective:   VITALS: Per patient if applicable, see vitals. GENERAL: Alert and in no acute distress. CARDIOPULMONARY: No increased WOB. Speaking in clear sentences.  PSYCH: Pleasant and cooperative. Speech normal rate and rhythm. Affect is appropriate. Insight and judgement are appropriate. Attention is focused, linear, and appropriate.  NEURO: Oriented as arrived to appointment on time with no prompting.   Lab Results  Component Value Date   CREATININE 0.70 11/18/2020   BUN 15 11/18/2020   NA 139 11/18/2020   K 4.0 11/18/2020   CL 103  11/18/2020   CO2 20 11/18/2020   Lab Results  Component Value Date   ALT 21 11/18/2020   AST 14 11/18/2020   ALKPHOS 73 11/18/2020   BILITOT 0.4 11/18/2020   Lab Results  Component Value Date   HGBA1C 5.4 11/08/2020   HGBA1C 5.2 03/11/2018   Lab Results  Component Value Date   INSULIN 10.5 11/18/2020   Lab Results  Component Value Date   TSH 3.160 11/08/2020   Lab Results  Component Value Date   CHOL 199 11/18/2020   HDL 52 11/18/2020   LDLCALC 125 (H) 11/18/2020   TRIG 122 11/18/2020   Lab Results  Component Value Date   WBC 13.3 (H) 09/14/2018   HGB 8.1 (L) 09/14/2018   HCT 26.0 (L) 09/14/2018   MCV 80.2 09/14/2018   PLT 224 09/14/2018   No results found for: IRON, TIBC, FERRITIN  Attestation Statements:   Reviewed by clinician on day of visit: allergies, medications, problem list, medical history, surgical history, family history, social history, and previous encounter notes.  Edmund Hilda, am acting as Energy manager for [employer], DO.  I have reviewed the above documentation for accuracy and completeness, and I agree with the above. Corinna Capra, DO

## 2020-12-27 ENCOUNTER — Encounter: Payer: Self-pay | Admitting: Physical Therapy

## 2020-12-27 ENCOUNTER — Other Ambulatory Visit: Payer: Self-pay

## 2020-12-27 ENCOUNTER — Ambulatory Visit: Payer: Medicaid Other | Attending: Certified Nurse Midwife | Admitting: Physical Therapy

## 2020-12-27 DIAGNOSIS — G8929 Other chronic pain: Secondary | ICD-10-CM | POA: Diagnosis not present

## 2020-12-27 DIAGNOSIS — R293 Abnormal posture: Secondary | ICD-10-CM | POA: Diagnosis not present

## 2020-12-27 DIAGNOSIS — M6281 Muscle weakness (generalized): Secondary | ICD-10-CM | POA: Diagnosis not present

## 2020-12-27 DIAGNOSIS — M545 Low back pain, unspecified: Secondary | ICD-10-CM | POA: Diagnosis not present
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
## 2021-01-03 ENCOUNTER — Other Ambulatory Visit: Payer: Self-pay

## 2021-01-03 ENCOUNTER — Ambulatory Visit: Payer: Medicaid Other | Admitting: Physical Therapy

## 2021-01-04 ENCOUNTER — Ambulatory Visit (INDEPENDENT_AMBULATORY_CARE_PROVIDER_SITE_OTHER): Payer: Medicaid Other | Admitting: Bariatrics

## 2021-01-04 ENCOUNTER — Other Ambulatory Visit (INDEPENDENT_AMBULATORY_CARE_PROVIDER_SITE_OTHER): Payer: Self-pay | Admitting: Bariatrics

## 2021-01-04 DIAGNOSIS — E8881 Metabolic syndrome: Secondary | ICD-10-CM

## 2021-01-04 DIAGNOSIS — E559 Vitamin D deficiency, unspecified: Secondary | ICD-10-CM

## 2021-01-04 NOTE — Telephone Encounter (Signed)
Dr.Brown 

## 2021-01-10 ENCOUNTER — Ambulatory Visit: Payer: Medicaid Other | Admitting: Physical Therapy

## 2021-01-10 ENCOUNTER — Other Ambulatory Visit: Payer: Self-pay

## 2021-01-10 ENCOUNTER — Encounter: Payer: Self-pay | Admitting: Physical Therapy

## 2021-01-10 DIAGNOSIS — G8929 Other chronic pain: Secondary | ICD-10-CM

## 2021-01-10 DIAGNOSIS — M545 Low back pain, unspecified: Secondary | ICD-10-CM | POA: Diagnosis not present

## 2021-01-10 DIAGNOSIS — R293 Abnormal posture: Secondary | ICD-10-CM | POA: Diagnosis not present

## 2021-01-10 DIAGNOSIS — M6281 Muscle weakness (generalized): Secondary | ICD-10-CM | POA: Diagnosis not present
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
## 2021-01-16 DIAGNOSIS — Z419 Encounter for procedure for purposes other than remedying health state, unspecified: Secondary | ICD-10-CM | POA: Diagnosis not present

## 2021-01-17 ENCOUNTER — Encounter: Payer: Medicaid Other | Admitting: Physical Therapy

## 2021-01-27 ENCOUNTER — Ambulatory Visit: Payer: Medicaid Other | Admitting: Physical Therapy

## 2021-02-07 ENCOUNTER — Encounter: Payer: Medicaid Other | Admitting: Physical Therapy

## 2021-02-16 DIAGNOSIS — Z419 Encounter for procedure for purposes other than remedying health state, unspecified: Secondary | ICD-10-CM | POA: Diagnosis not present

## 2021-03-18 DIAGNOSIS — Z419 Encounter for procedure for purposes other than remedying health state, unspecified: Secondary | ICD-10-CM | POA: Diagnosis not present

## 2021-04-18 DIAGNOSIS — Z419 Encounter for procedure for purposes other than remedying health state, unspecified: Secondary | ICD-10-CM | POA: Diagnosis not present

## 2021-05-19 DIAGNOSIS — Z419 Encounter for procedure for purposes other than remedying health state, unspecified: Secondary | ICD-10-CM | POA: Diagnosis not present

## 2021-06-18 DIAGNOSIS — Z419 Encounter for procedure for purposes other than remedying health state, unspecified: Secondary | ICD-10-CM | POA: Diagnosis not present

## 2021-07-19 DIAGNOSIS — Z419 Encounter for procedure for purposes other than remedying health state, unspecified: Secondary | ICD-10-CM | POA: Diagnosis not present

## 2021-08-18 DIAGNOSIS — Z419 Encounter for procedure for purposes other than remedying health state, unspecified: Secondary | ICD-10-CM | POA: Diagnosis not present

## 2021-09-18 DIAGNOSIS — Z419 Encounter for procedure for purposes other than remedying health state, unspecified: Secondary | ICD-10-CM | POA: Diagnosis not present

## 2021-10-19 DIAGNOSIS — Z419 Encounter for procedure for purposes other than remedying health state, unspecified: Secondary | ICD-10-CM | POA: Diagnosis not present

## 2021-11-11 ENCOUNTER — Encounter: Payer: Medicaid Other | Admitting: Certified Nurse Midwife

## 2021-11-16 DIAGNOSIS — Z419 Encounter for procedure for purposes other than remedying health state, unspecified: Secondary | ICD-10-CM | POA: Diagnosis not present

## 2021-12-17 DIAGNOSIS — Z419 Encounter for procedure for purposes other than remedying health state, unspecified: Secondary | ICD-10-CM | POA: Diagnosis not present

## 2022-01-12 DIAGNOSIS — Z0120 Encounter for dental examination and cleaning without abnormal findings: Secondary | ICD-10-CM | POA: Diagnosis not present

## 2022-01-16 DIAGNOSIS — Z419 Encounter for procedure for purposes other than remedying health state, unspecified: Secondary | ICD-10-CM | POA: Diagnosis not present

## 2022-02-16 DIAGNOSIS — Z419 Encounter for procedure for purposes other than remedying health state, unspecified: Secondary | ICD-10-CM | POA: Diagnosis not present

## 2022-03-18 DIAGNOSIS — Z419 Encounter for procedure for purposes other than remedying health state, unspecified: Secondary | ICD-10-CM | POA: Diagnosis not present

## 2022-04-26 ENCOUNTER — Encounter (INDEPENDENT_AMBULATORY_CARE_PROVIDER_SITE_OTHER): Payer: Self-pay
